# Patient Record
Sex: Female | Born: 1985 | Race: White | Hispanic: No | Marital: Single | Smoking: Never smoker
Health system: Southern US, Community
[De-identification: ages and names within clinical notes are randomized; demographics above are authoritative.]

## PROBLEM LIST (undated history)

## (undated) VITALS — BP 97/68 | HR 88 | Temp 97.9°F | Resp 16 | Ht <= 58 in | Wt 99.0 lb

## (undated) DIAGNOSIS — F329 Major depressive disorder, single episode, unspecified: Secondary | ICD-10-CM

## (undated) DIAGNOSIS — R569 Unspecified convulsions: Secondary | ICD-10-CM

## (undated) DIAGNOSIS — G40909 Epilepsy, unspecified, not intractable, without status epilepticus: Secondary | ICD-10-CM

## (undated) DIAGNOSIS — R51 Headache: Secondary | ICD-10-CM

## (undated) DIAGNOSIS — F32A Depression, unspecified: Secondary | ICD-10-CM

## (undated) DIAGNOSIS — E785 Hyperlipidemia, unspecified: Secondary | ICD-10-CM

## (undated) DIAGNOSIS — G43909 Migraine, unspecified, not intractable, without status migrainosus: Secondary | ICD-10-CM

## (undated) DIAGNOSIS — K529 Noninfective gastroenteritis and colitis, unspecified: Secondary | ICD-10-CM

## (undated) DIAGNOSIS — F419 Anxiety disorder, unspecified: Secondary | ICD-10-CM

## (undated) DIAGNOSIS — G47419 Narcolepsy without cataplexy: Secondary | ICD-10-CM

## (undated) HISTORY — DX: Epilepsy, unspecified, not intractable, without status epilepticus: G40.909

## (undated) HISTORY — PX: BILATERAL HIP ARTHROSCOPY: SUR89

## (undated) HISTORY — DX: Migraine, unspecified, not intractable, without status migrainosus: G43.909

## (undated) HISTORY — DX: Narcolepsy without cataplexy: G47.419

## (undated) HISTORY — DX: Hyperlipidemia, unspecified: E78.5

---
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
## 2004-02-16 ENCOUNTER — Ambulatory Visit: Payer: Self-pay | Admitting: Internal Medicine

## 2004-02-28 ENCOUNTER — Ambulatory Visit (HOSPITAL_COMMUNITY): Admission: RE | Admit: 2004-02-28 | Discharge: 2004-02-28 | Payer: Self-pay | Admitting: Internal Medicine

## 2004-02-28 ENCOUNTER — Ambulatory Visit: Payer: Self-pay | Admitting: Internal Medicine

## 2004-03-08 ENCOUNTER — Ambulatory Visit: Payer: Self-pay | Admitting: Internal Medicine

## 2004-03-09 ENCOUNTER — Ambulatory Visit: Payer: Self-pay | Admitting: Internal Medicine

## 2004-09-12 ENCOUNTER — Other Ambulatory Visit: Admission: RE | Admit: 2004-09-12 | Discharge: 2004-09-12 | Payer: Self-pay | Admitting: Obstetrics and Gynecology

## 2005-10-02 ENCOUNTER — Other Ambulatory Visit: Admission: RE | Admit: 2005-10-02 | Discharge: 2005-10-02 | Payer: Self-pay | Admitting: Obstetrics and Gynecology

## 2005-10-02 ENCOUNTER — Encounter: Payer: Self-pay | Admitting: Family Medicine

## 2005-10-02 LAB — CONVERTED CEMR LAB

## 2005-10-17 ENCOUNTER — Ambulatory Visit: Payer: Self-pay | Admitting: Family Medicine

## 2006-08-16 ENCOUNTER — Emergency Department (HOSPITAL_COMMUNITY): Admission: EM | Admit: 2006-08-16 | Discharge: 2006-08-16 | Payer: Self-pay | Admitting: Emergency Medicine

## 2006-08-19 ENCOUNTER — Encounter: Payer: Self-pay | Admitting: Family Medicine

## 2006-09-14 ENCOUNTER — Encounter: Payer: Self-pay | Admitting: Family Medicine

## 2006-09-15 ENCOUNTER — Encounter: Payer: Self-pay | Admitting: Family Medicine

## 2006-10-01 ENCOUNTER — Encounter: Payer: Self-pay | Admitting: Family Medicine

## 2006-10-01 DIAGNOSIS — F5000 Anorexia nervosa, unspecified: Secondary | ICD-10-CM | POA: Insufficient documentation

## 2006-10-01 DIAGNOSIS — K3184 Gastroparesis: Secondary | ICD-10-CM | POA: Insufficient documentation

## 2006-10-01 DIAGNOSIS — K219 Gastro-esophageal reflux disease without esophagitis: Secondary | ICD-10-CM | POA: Insufficient documentation

## 2006-10-01 DIAGNOSIS — N946 Dysmenorrhea, unspecified: Secondary | ICD-10-CM | POA: Insufficient documentation

## 2006-10-01 DIAGNOSIS — G40309 Generalized idiopathic epilepsy and epileptic syndromes, not intractable, without status epilepticus: Secondary | ICD-10-CM | POA: Insufficient documentation

## 2006-10-01 DIAGNOSIS — F988 Other specified behavioral and emotional disorders with onset usually occurring in childhood and adolescence: Secondary | ICD-10-CM | POA: Insufficient documentation

## 2006-10-01 DIAGNOSIS — F411 Generalized anxiety disorder: Secondary | ICD-10-CM | POA: Insufficient documentation

## 2006-10-01 DIAGNOSIS — K5900 Constipation, unspecified: Secondary | ICD-10-CM | POA: Insufficient documentation

## 2006-10-01 DIAGNOSIS — J309 Allergic rhinitis, unspecified: Secondary | ICD-10-CM | POA: Insufficient documentation

## 2006-10-01 DIAGNOSIS — R110 Nausea: Secondary | ICD-10-CM | POA: Insufficient documentation

## 2006-10-02 ENCOUNTER — Ambulatory Visit: Payer: Self-pay | Admitting: Family Medicine

## 2006-10-02 DIAGNOSIS — H6980 Other specified disorders of Eustachian tube, unspecified ear: Secondary | ICD-10-CM | POA: Insufficient documentation

## 2006-10-02 DIAGNOSIS — G40309 Generalized idiopathic epilepsy and epileptic syndromes, not intractable, without status epilepticus: Secondary | ICD-10-CM | POA: Insufficient documentation

## 2006-10-02 DIAGNOSIS — G47411 Narcolepsy with cataplexy: Secondary | ICD-10-CM | POA: Insufficient documentation

## 2007-03-16 ENCOUNTER — Ambulatory Visit: Payer: Self-pay | Admitting: Family Medicine

## 2007-03-16 DIAGNOSIS — R5383 Other fatigue: Secondary | ICD-10-CM

## 2007-03-16 DIAGNOSIS — R5381 Other malaise: Secondary | ICD-10-CM | POA: Insufficient documentation

## 2007-03-18 LAB — CONVERTED CEMR LAB
Basophils Absolute: 0 10*3/uL (ref 0.0–0.1)
Basophils Relative: 0.1 % (ref 0.0–1.0)
Eosinophils Relative: 0.8 % (ref 0.0–5.0)
Lymphocytes Relative: 31.6 % (ref 12.0–46.0)
MCV: 92.3 fL (ref 78.0–100.0)
Monocytes Absolute: 0.4 10*3/uL (ref 0.2–0.7)
Neutrophils Relative %: 60.9 % (ref 43.0–77.0)
Platelets: 265 10*3/uL (ref 150–400)
WBC: 6.7 10*3/uL (ref 4.5–10.5)

## 2007-03-20 ENCOUNTER — Telehealth: Payer: Self-pay | Admitting: Family Medicine

## 2007-03-23 ENCOUNTER — Telehealth: Payer: Self-pay | Admitting: Family Medicine

## 2007-09-02 ENCOUNTER — Encounter: Payer: Self-pay | Admitting: Family Medicine

## 2007-09-03 ENCOUNTER — Encounter: Admission: RE | Admit: 2007-09-03 | Discharge: 2007-09-03 | Payer: Self-pay | Admitting: Gastroenterology

## 2007-09-03 ENCOUNTER — Encounter: Payer: Self-pay | Admitting: Family Medicine

## 2007-09-04 ENCOUNTER — Encounter: Payer: Self-pay | Admitting: Family Medicine

## 2007-12-28 ENCOUNTER — Telehealth: Payer: Self-pay | Admitting: Family Medicine

## 2008-07-08 ENCOUNTER — Encounter: Payer: Self-pay | Admitting: Family Medicine

## 2008-07-12 ENCOUNTER — Telehealth (INDEPENDENT_AMBULATORY_CARE_PROVIDER_SITE_OTHER): Payer: Self-pay | Admitting: *Deleted

## 2008-07-18 ENCOUNTER — Ambulatory Visit: Payer: Self-pay | Admitting: Family Medicine

## 2008-07-18 DIAGNOSIS — N912 Amenorrhea, unspecified: Secondary | ICD-10-CM | POA: Insufficient documentation

## 2008-07-18 DIAGNOSIS — I8390 Asymptomatic varicose veins of unspecified lower extremity: Secondary | ICD-10-CM | POA: Insufficient documentation

## 2008-07-25 ENCOUNTER — Ambulatory Visit: Payer: Self-pay | Admitting: Family Medicine

## 2008-07-26 LAB — CONVERTED CEMR LAB
ALT: 18 units/L (ref 0–35)
Albumin: 4.3 g/dL (ref 3.5–5.2)
Alkaline Phosphatase: 62 units/L (ref 39–117)
BUN: 9 mg/dL (ref 6–23)
Basophils Absolute: 0 10*3/uL (ref 0.0–0.1)
Basophils Relative: 0.3 % (ref 0.0–3.0)
CO2: 30 meq/L (ref 19–32)
Calcium: 9.4 mg/dL (ref 8.4–10.5)
Chloride: 106 meq/L (ref 96–112)
Cholesterol: 295 mg/dL — ABNORMAL HIGH (ref 0–200)
Creatinine, Ser: 1 mg/dL (ref 0.4–1.2)
Direct LDL: 174.9 mg/dL
FSH: 7.7 milliintl units/mL
GFR calc non Af Amer: 72.51 mL/min (ref >60)
Glucose, Bld: 87 mg/dL (ref 70–99)
HCT: 39.3 % (ref 36.0–46.0)
Hemoglobin: 13.3 g/dL (ref 12.0–15.0)
Lymphs Abs: 2.4 10*3/uL (ref 0.7–4.0)
MCV: 91.8 fL (ref 78.0–100.0)
Neutro Abs: 2.3 10*3/uL (ref 1.4–7.7)
Platelets: 219 10*3/uL (ref 150.0–400.0)
RBC: 4.29 M/uL (ref 3.87–5.11)
Sodium: 141 meq/L (ref 135–145)
Triglycerides: 41 mg/dL (ref 0.0–149.0)
VLDL: 8.2 mg/dL (ref 0.0–40.0)
Vitamin B-12: 980 pg/mL — ABNORMAL HIGH (ref 211–911)
WBC: 5.3 10*3/uL (ref 4.5–10.5)

## 2008-11-03 ENCOUNTER — Ambulatory Visit: Payer: Self-pay | Admitting: Family Medicine

## 2008-11-03 DIAGNOSIS — E7800 Pure hypercholesterolemia, unspecified: Secondary | ICD-10-CM | POA: Insufficient documentation

## 2008-11-03 LAB — CONVERTED CEMR LAB
ALT: 17 units/L (ref 0–35)
Cholesterol: 258 mg/dL — ABNORMAL HIGH (ref 0–200)
HDL: 96.2 mg/dL (ref >39.00)
Triglycerides: 45 mg/dL (ref 0.0–149.0)

## 2009-01-12 ENCOUNTER — Encounter: Payer: Self-pay | Admitting: Family Medicine

## 2009-01-12 ENCOUNTER — Emergency Department (HOSPITAL_COMMUNITY): Admission: EM | Admit: 2009-01-12 | Discharge: 2009-01-12 | Payer: Self-pay | Admitting: Emergency Medicine

## 2009-01-25 ENCOUNTER — Ambulatory Visit: Payer: Self-pay | Admitting: Family Medicine

## 2009-01-25 DIAGNOSIS — R519 Headache, unspecified: Secondary | ICD-10-CM | POA: Insufficient documentation

## 2009-01-25 DIAGNOSIS — R51 Headache: Secondary | ICD-10-CM | POA: Insufficient documentation

## 2009-01-25 DIAGNOSIS — J029 Acute pharyngitis, unspecified: Secondary | ICD-10-CM | POA: Insufficient documentation

## 2009-01-25 DIAGNOSIS — M542 Cervicalgia: Secondary | ICD-10-CM | POA: Insufficient documentation

## 2009-05-18 ENCOUNTER — Telehealth: Payer: Self-pay | Admitting: Family Medicine

## 2009-06-29 ENCOUNTER — Encounter: Payer: Self-pay | Admitting: Family Medicine

## 2009-07-18 ENCOUNTER — Ambulatory Visit (HOSPITAL_COMMUNITY): Admission: RE | Admit: 2009-07-18 | Discharge: 2009-07-18 | Payer: Self-pay | Admitting: Endocrinology

## 2009-07-19 ENCOUNTER — Encounter: Admission: RE | Admit: 2009-07-19 | Discharge: 2009-07-19 | Payer: Self-pay | Admitting: Endocrinology

## 2009-09-06 ENCOUNTER — Encounter: Admission: RE | Admit: 2009-09-06 | Discharge: 2009-09-06 | Payer: Self-pay | Admitting: Sports Medicine

## 2009-11-07 ENCOUNTER — Encounter (INDEPENDENT_AMBULATORY_CARE_PROVIDER_SITE_OTHER): Payer: Self-pay | Admitting: *Deleted

## 2010-03-25 ENCOUNTER — Encounter: Payer: Self-pay | Admitting: Internal Medicine

## 2010-04-03 NOTE — Progress Notes (Signed)
  Phone Note Call from Patient   Caller: Patient Summary of Call: Patient wants to be referred to Dr Dorisann Frames for Endocrinology for hormone problems. Please call her back [phone], [phone]. Initial call taken by: Carlton Adam,  May 18, 2009 11:15 AM  Follow-up for Phone Call        before I refer - please have her list some of her symptoms so I know what ref is for- thanks Follow-up by: Judith Part MD,  May 18, 2009 1:38 PM  Additional Follow-up for Phone Call Additional follow up Details #1::        Symptoms are really bad nite sweats and hot flashes, no period for the last 4 years, unexplained weight gain, menopausal symptoms in general.Patient saw an Endocrinologist when she was 25 years old B/C she was abnormally short for her age and so they wanted to check up on that. They told her mother back then that she should FU with an Endocrinologist in the future if she has any type of abnormal problems not explained by anything. She also has fatigue issues that are not normal for her. Additional Follow-up by: Carlton Adam,  May 22, 2009 12:37 PM    Additional Follow-up for Phone Call Additional follow up Details #2::    thanks - I will do ref and route to [facility] Follow-up by: Judith Part MD,  May 22, 2009 11:09 PM  Additional Follow-up for Phone Call Additional follow up Details #3:: Details for Additional Follow-up Action Taken: Referral request for Endocrinology faxed to Dr Talmage Nap, Dellia Nims fro an appt to be made. Additional Follow-up by: Carlton Adam,  May 23, 2009 11:43 AM

## 2010-04-03 NOTE — Letter (Signed)
Summary: [place] No Show Letter  [place] [facility]  [address] [place], [place] [address]   Phone: [phone]  Fax: [phone]    11/07/2009 MRN: [id number]  [facility] [address] [place], [place]  [address]   Dear Ms. Fleishman,   Our records indicate that you missed your scheduled appointment with _______LAB______________ on _____9/2/11_______.  Please contact this office to reschedule your appointment as soon as possible.  It is important that you keep your scheduled appointments with your physician, so we can provide you the best care possible.  Please be advised that there may be a charge for "no show" appointments.    Sincerely,   [place] at [facility]

## 2010-04-03 NOTE — Consult Note (Signed)
Summary: [facility]   Imported By: Maryln Gottron 10/17/2009 11:09:24  _____________________________________________________________________  External Attachment:    Type:   Image     Comment:   External Document

## 2010-06-21 ENCOUNTER — Other Ambulatory Visit: Payer: Self-pay | Admitting: Family Medicine

## 2010-06-21 DIAGNOSIS — L989 Disorder of the skin and subcutaneous tissue, unspecified: Secondary | ICD-10-CM

## 2010-06-21 DIAGNOSIS — R221 Localized swelling, mass and lump, neck: Secondary | ICD-10-CM

## 2010-06-22 ENCOUNTER — Other Ambulatory Visit: Payer: Self-pay

## 2010-06-25 ENCOUNTER — Ambulatory Visit
Admission: RE | Admit: 2010-06-25 | Discharge: 2010-06-25 | Disposition: A | Discharge status: Home / Self Care | Payer: 59 | Source: Ambulatory Visit | Attending: Family Medicine | Admitting: Family Medicine

## 2010-06-25 DIAGNOSIS — L989 Disorder of the skin and subcutaneous tissue, unspecified: Secondary | ICD-10-CM

## 2010-06-25 DIAGNOSIS — R221 Localized swelling, mass and lump, neck: Secondary | ICD-10-CM

## 2012-05-26 ENCOUNTER — Encounter (HOSPITAL_COMMUNITY): Payer: Self-pay | Admitting: *Deleted

## 2012-05-26 ENCOUNTER — Inpatient Hospital Stay (HOSPITAL_COMMUNITY)
Admission: RE | Admit: 2012-05-26 | Discharge: 2012-05-29 | DRG: 426 | Disposition: A | Discharge status: Home / Self Care | Payer: BC Managed Care – PPO | Attending: Psychiatry | Admitting: Psychiatry

## 2012-05-26 DIAGNOSIS — Z79899 Other long term (current) drug therapy: Secondary | ICD-10-CM

## 2012-05-26 DIAGNOSIS — F411 Generalized anxiety disorder: Secondary | ICD-10-CM | POA: Diagnosis present

## 2012-05-26 DIAGNOSIS — F32A Depression, unspecified: Secondary | ICD-10-CM | POA: Diagnosis present

## 2012-05-26 DIAGNOSIS — G40309 Generalized idiopathic epilepsy and epileptic syndromes, not intractable, without status epilepticus: Secondary | ICD-10-CM | POA: Diagnosis present

## 2012-05-26 DIAGNOSIS — R45851 Suicidal ideations: Secondary | ICD-10-CM

## 2012-05-26 DIAGNOSIS — F3289 Other specified depressive episodes: Principal | ICD-10-CM | POA: Diagnosis present

## 2012-05-26 DIAGNOSIS — F329 Major depressive disorder, single episode, unspecified: Secondary | ICD-10-CM

## 2012-05-26 DIAGNOSIS — F5000 Anorexia nervosa, unspecified: Secondary | ICD-10-CM | POA: Diagnosis present

## 2012-05-26 HISTORY — DX: Headache: R51

## 2012-05-26 HISTORY — DX: Noninfective gastroenteritis and colitis, unspecified: K52.9

## 2012-05-26 HISTORY — DX: Depression, unspecified: F32.A

## 2012-05-26 HISTORY — DX: Major depressive disorder, single episode, unspecified: F32.9

## 2012-05-26 HISTORY — DX: Anxiety disorder, unspecified: F41.9

## 2012-05-26 HISTORY — DX: Unspecified convulsions: R56.9

## 2012-05-26 LAB — COMPREHENSIVE METABOLIC PANEL
ALT: 14 U/L (ref 0–35)
AST: 26 U/L (ref 0–37)
Albumin: 4.4 g/dL (ref 3.5–5.2)
CO2: 31 mEq/L (ref 19–32)
Calcium: 10.2 mg/dL (ref 8.4–10.5)
Creatinine, Ser: 0.73 mg/dL (ref 0.50–1.10)
Sodium: 140 mEq/L (ref 135–145)
Total Protein: 7.8 g/dL (ref 6.0–8.3)

## 2012-05-26 LAB — CBC
MCH: 30.4 pg (ref 26.0–34.0)
MCHC: 33.1 g/dL (ref 30.0–36.0)
MCV: 91.9 fL (ref 78.0–100.0)
Platelets: 274 10*3/uL (ref 150–400)
RBC: 4.57 MIL/uL (ref 3.87–5.11)
RDW: 12.3 % (ref 11.5–15.5)

## 2012-05-26 MED ORDER — LAMOTRIGINE 200 MG PO TABS
200.0000 mg | ORAL_TABLET | Freq: Two times a day (BID) | ORAL | Status: DC
  Administered 2012-05-26 – 2012-05-29 (×6): 200 mg via ORAL
  Filled 2012-05-26: qty 2
  Filled 2012-05-26 (×10): qty 1

## 2012-05-26 MED ORDER — TRAZODONE HCL 50 MG PO TABS
50.0000 mg | ORAL_TABLET | Freq: Every evening | ORAL | Status: DC | PRN
  Administered 2012-05-26: 50 mg via ORAL
  Filled 2012-05-26 (×7): qty 1

## 2012-05-26 MED ORDER — MAGNESIUM HYDROXIDE 400 MG/5ML PO SUSP: 30.0000 mL | Freq: Every day | ORAL | Status: DC | PRN

## 2012-05-26 MED ORDER — ACETAMINOPHEN 325 MG PO TABS
650.0000 mg | ORAL_TABLET | Freq: Four times a day (QID) | ORAL | Status: DC | PRN
  Administered 2012-05-26 – 2012-05-27 (×2): 650 mg via ORAL

## 2012-05-26 MED ORDER — HYDROXYZINE HCL 25 MG PO TABS: 25.0000 mg | ORAL_TABLET | Freq: Four times a day (QID) | ORAL | Status: DC | PRN

## 2012-05-26 MED ORDER — ALUM & MAG HYDROXIDE-SIMETH 200-200-20 MG/5ML PO SUSP: 30.0000 mL | ORAL | Status: DC | PRN

## 2012-05-26 NOTE — Progress Notes (Signed)
Patient ID: Abigail Lowery, female   DOB: 11/09/1985, 27 y.o.   MRN: [id number]  Pt admission documentation was finished From [facility] navigators) to the end.

## 2012-05-26 NOTE — Progress Notes (Signed)
Pt reports she is doing ok since she got here.  She has been introduced to the other young ladies on the unit and says she is feeling better about being here.  She says if she has any harmful thoughts, she will come to staff.  She denies AVH.  Pt was encouraged to make her needs known to staff.  Support/encouragement given.  Pt voiced no needs/concerns at this time.  Safety maintained with q15 minute checks.
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
## 2012-05-27 DIAGNOSIS — F411 Generalized anxiety disorder: Secondary | ICD-10-CM

## 2012-05-27 DIAGNOSIS — F332 Major depressive disorder, recurrent severe without psychotic features: Secondary | ICD-10-CM

## 2012-05-27 LAB — RAPID URINE DRUG SCREEN, HOSP PERFORMED
Amphetamines: NOT DETECTED
Benzodiazepines: NOT DETECTED
Opiates: NOT DETECTED

## 2012-05-27 LAB — URINALYSIS, ROUTINE W REFLEX MICROSCOPIC
Glucose, UA: NEGATIVE mg/dL
Leukocytes, UA: NEGATIVE
Nitrite: NEGATIVE
Specific Gravity, Urine: 1.01 (ref 1.005–1.030)
pH: 7.5 (ref 5.0–8.0)

## 2012-05-27 LAB — PREGNANCY, URINE: Preg Test, Ur: NEGATIVE

## 2012-05-27 MED ORDER — BUPROPION HCL 75 MG PO TABS
37.5000 mg | ORAL_TABLET | Freq: Every day | ORAL | Status: DC
  Administered 2012-05-27 – 2012-05-29 (×3): 37.5 mg via ORAL
  Filled 2012-05-27 (×4): qty 0.5
  Filled 2012-05-27: qty 1

## 2012-05-27 MED ORDER — TRAZODONE 25 MG HALF TABLET
25.0000 mg | ORAL_TABLET | Freq: Every evening | ORAL | Status: DC | PRN
  Administered 2012-05-27 – 2012-05-28 (×2): 25 mg via ORAL
  Filled 2012-05-27 (×8): qty 1

## 2012-05-27 NOTE — Progress Notes (Signed)
Recreation Therapy Notes  Date: 03.26.2014 Time: 3:00pm Location: [facility]      Group Topic/Focus: Goal Setting  Participation Level: Active  Participation Quality: Appropriate  Affect: Euthymic  Cognitive: Appropriate  Additional Comments: Patient with peer play "Joined at the Hip" a game that required patients hold a beach ball between their hips and set a goal for how far they think they can walk without the ball dropping to the floor. Patient with LRT set goal and reached goal. Patient with LRT set goal for how far they could walk holding the beach ball with one finger. Patient with LRT reached goal. Patient offered three suggestions to peer for reaching personal goal. Patient actively participated in group activity. Patient participated in discussion about importance of goals setting and how it can effect communication skills and self-esteem.   Marykay Lex Guenther [place], LRT/CTRS  Devereaux Grayson L 05/27/2012 4:09 PM
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
## 2012-05-27 NOTE — [facility] Suicide Risk Assessment (Signed)
Suicide Risk Assessment  Admission Assessment     Nursing information obtained from:    Demographic factors:    Current Mental Status:   Alert and oriented to 4. Denies SI/HI/AH/VH. Loss Factors:   educational issues Historical Factors:   Long history of depression Risk Reduction Factors:   compliant with traetment  CLINICAL FACTORS:   Depression:   Anhedonia Hopelessness Impulsivity Insomnia Severe  COGNITIVE FEATURES THAT CONTRIBUTE TO RISK:  Thought constriction (tunnel vision)    SUICIDE RISK:   Mild:  Suicidal ideation of limited frequency, intensity, duration, and specificity.  There are no identifiable plans, no associated intent, mild dysphoria and related symptoms, good self-control (both objective and subjective assessment), few other risk factors, and identifiable protective factors, including available and accessible social support.  PLAN OF CARE: Adjust medications as needed. Provide supportive counselling.  I certify that inpatient services furnished can reasonably be expected to improve the patient's condition.  Abigail Lowery 05/27/2012, 1:39 PM

## 2012-05-27 NOTE — Tx Team (Signed)
Interdisciplinary Treatment Plan Update   Date Reviewed:  05/27/2012  Time Reviewed:  10:05 AM  Progress in Treatment:   Attending groups: Yes Participating in groups: Yes Taking medication as prescribed: Yes  Tolerating medication: Yes Family/Significant other contact made: Patient to be asked for consent to make contact with family. Patient understands diagnosis: Yes  Discussing patient identified problems/goals with staff: Yes Medical problems stabilized or resolved: Yes Denies suicidal/homicidal ideation: Yes Patient has not harmed self or others: Yes  For review of initial/current patient goals, please see plan of care.  Estimated Length of Stay:  2-4 days  Reasons for Continued Hospitalization:  Anxiety Depression Medication stabilization  New Problems/Goals identified:    Discharge Plan or Barriers:   Home with outpatient follow up  Additional Comments:   Patient advised of increased depression over the past two months but unable to identify a specific stressor.  She endorsed SI prior to admission but denies SI/HI at this time.  Patient rates depression at eight and anxiety at nine.  She advised of having outpatient providers and access to medications.  Attendees:  Patient:  05/27/2012 10:05 AM   Signature: Patrick North, MD 05/27/2012 10:05 AM  Signature:Tina Arlana Pouch, RN 05/27/2012 10:05 AM  Signature: Harold Barban, RN 05/27/2012 10:05 AM  Signature: 05/27/2012 10:05 AM  Signature:   05/27/2012 10:05 AM  Signature:  Juline Patch, LCSW 05/27/2012 10:05 AM  Signature:  05/27/2012 10:05 AM  Signature:  05/27/2012 10:05 AM  Signature: Fransisca Kaufmann, [facility] 05/27/2012 10:05 AM  Signature:    Signature:    Signature:      Scribe for Treatment Team:   Juline Patch,  05/27/2012 10:05 AM

## 2012-05-27 NOTE — Progress Notes (Signed)
  D) Patient pleasant and cooperative upon my assessment. Patient completed Patient Self Inventory, reports slept "fair," and  appetite is "poor." Patient states "I will eat at snack times." Patient rates depression as   5/10, patient rates hopeless feelings as  5/10. Patient endorses passive SI, contracts verbally for safety with RN. Patient denies HI, denies A/V hallucinations.   A) Patient offered support and encouragement, patient encouraged to discuss feelings/concerns with staff. Patient verbalized understanding. Patient monitored Q15 minutes for safety. Patient met with MD  to discuss today's goals and plan of care.  R) Patient visible in milieu, attending groups in day room and meals in dining room. Patient appropriate with staff and peers.   Patient taking medications as ordered. Patient has a plan to "change my eating habits and sleep more normally."  Will continue to monitor.

## 2012-05-27 NOTE — [facility] Counselor (Signed)
Adult Comprehensive Assessment  Patient ID: Abigail Lowery, female   DOB: 02-25-1986, 27 y.o.   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  She reports struggling with classes due to her illness Employment / Job issues: Patient is employed.  She reports getting to work late due to depression and Narcolepsy Family Relationships: None Financial / Lack of resources (include bankruptcy): Could use more money Housing / Lack of housing: None - patient lives with parents Physical health (include injuries & life threatening diseases): Narcolepsy, Epilepsy and Colitis Social relationships: Awkward in social relationship Substance abuse: None  Living/Environment/Situation:  Living Arrangements: Parent Living conditions (as described by patient or guardian): Tense due to her illness How long has patient lived in current situation?: six years What is atmosphere in current home: Comfortable;Loving;Supportive  Family History:  Marital status: Single Does patient have children?: No  Childhood History:  By whom was/is the patient raised?: Both parents Additional childhood history information: Patient reports ahving a great childhood Description of patient's relationship with caregiver when they were a child: Very good Patient's description of current relationship with people who raised him/her: Patient reprots relatioship with parents is good but tense Does patient have siblings?: Yes Number of Siblings: 2 Description of patient's current relationship with siblings: Good Did patient suffer any verbal/emotional/physical/sexual abuse as a child?: No Did patient suffer from severe childhood neglect?: No Has patient ever been sexually abused/assaulted/raped as an adolescent or adult?: No Was the patient ever a victim of a crime or a disaster?: No Witnessed domestic violence?: No Has patient  been effected by domestic violence as an adult?: No  Education:  Highest grade of school patient has completed: Automotive engineer and one year of study towards Masters Currently a Consulting civil engineer?: No Contact person: Film/video editor [phone] Learning disability?: No  Employment/Work Situation:   Employment situation: Employed Where is patient currently employed?: [place] long has patient been employed?: 4 years Patient's job has been impacted by current illness: Yes Describe how patient's job has been implacted: Tends to get to work late What is the longest time patient has a held a job?: four years Where was the patient employed at that time?: Currently employer Has patient ever been in the [employer]?: No Has patient ever served in combat?: No  Financial Resources:   Financial resources: Income from employment Does patient have a representative payee or guardian?: No  Alcohol/Substance Abuse:   What has been your use of drugs/alcohol within the last 12 months?: None If attempted suicide, did drugs/alcohol play a role in this?: No Alcohol/Substance Abuse Treatment Hx: Denies past history Has alcohol/substance abuse ever caused legal problems?: No  Social Support System:   Forensic psychologist System: None Describe Community Support System: N/A Type of faith/religion: Catholic How does patient's faith help to cope with current illness?: Goes to confession  Leisure/Recreation:   Leisure and Hobbies: Music, Arts and crafts  Strengths/Needs:   What things does the patient do well?: Singing, Playing flute In what areas does patient struggle / problems for patient: Mental and physical illness  Discharge Plan:   Does patient have access to transportation?: Yes Will patient be returning to same living situation after discharge?: Yes Currently receiving community mental health services: Yes (From Whom) (Dr. Nolen Mu, [facility] and [facility]) Does patient have  financial barriers related to discharge medications?: No  Summary/Recommendations:  Abigail Lowery is a 27 years old  Caucasian female admitted with Anxiety Disorder and Major Depression Disorder.  She will Patient will benefit from crisis stabilization, evaluation for medication management, psycho education groups for coping skills development, group therapy and assistance with discharge planning.     Abigail Lowery, Joesph July. 05/27/2012

## 2012-05-27 NOTE — Progress Notes (Signed)
[facility] INPATIENT:  Family/Significant Other Suicide Prevention Education  Suicide Prevention Education:  Education Completed: Andrena Margerum, Father, [phone] has been identified by the patient as the family member/significant other with whom the patient will be residing, and identified as the person(s) who will aid the patient in the event of a mental health crisis (suicidal ideations/suicide attempt).  With written consent from the patient, the family member/significant other has been provided the following suicide prevention education, prior to the and/or following the discharge of the patient.  The suicide prevention education provided includes the following:  Suicide risk factors  Suicide prevention and interventions  National Suicide Hotline telephone number  [facility] assessment telephone number  [facility] Emergency Assistance 911  [place] and/or Residential Mobile Crisis Unit telephone number  Request made of family/significant other to:  Remove weapons (e.g., guns, rifles, knives), all items previously/currently identified as safety concern.  Father reports there are no guns in the home.  Remove drugs/medications (over-the-counter, prescriptions, illicit drugs), all items previously/currently identified as a safety concern.  The family member/significant other verbalizes understanding of the suicide prevention education information provided.  The family member/significant other agrees to remove the items of safety concern listed above.  Wynn Banker 05/27/2012, 3:58 PM

## 2012-05-27 NOTE — Progress Notes (Signed)
D: Patient's parents visited, she reported a good visit with parents. She appeared bright and appropriate on approach. Patient stated that she had a rough day in the morning; "My day started off  frustrating, they won't give me my medication for narcolepsy, my psychologist called to check on me and I felt better after she spoke with me". She reported that the doctor started her on antidepressant, and that her bedtime Trazodone was too much and the physician promised to reduce the dosage.  A: Writer checked patient medication orders and there wasn't any change in the dosage of Trazodone; writer to notified the PA whenever he is less busy and on the floor and have the dosage changed.Patient encouraged and supported. She denied SI/HI and denied Hallucinations. R: Patient receptive to encouragement and support

## 2012-05-27 NOTE — Progress Notes (Signed)
[facility] LCSW Group Therapy  Emotional Regulation  05/27/2012 3:55 PM  Type of Therapy:  Group Therapy  Participation Level:  Active  Participation Quality:  Appropriate and Attentive  Affect:  Appropriate, Depressed, Flat and Tearful  Cognitive:  Appropriate  Insight:  Engaged  Engagement in Therapy:  Engaged  Modes of Intervention:  Discussion, Education, Exploration, Problem-solving, Rapport Building and Support  Summary of Progress/Problems:  Patient shared the emotion she struggles with is feeling invalidated.  She was tearful as she talked about how she feels others would be better off if she were not here but is also able to consider how how death would effect those she loves.    Wynn Banker 05/27/2012, 3:55 PM

## 2012-05-28 NOTE — Clinical Social Work Note (Signed)
Late Entry for 05/27/12:  Writer spoke with patient's father who advised patient has a history of a serious eating disorder.  He shared during patient's senior year in high school she had lost down to 69 pounds and almost died.  He advised her disorder is not as bad at this time but it takes her two hours to eat the amount of food most people can eat in five minutes.  He also shared concern that patient is on a great deal of medications and he does not know how she functions.  Father also advised patient can sleep through the entire weekend without getting up to eat or use the bathroom.  MD and NP was advised of father's concerns.

## 2012-05-28 NOTE — Progress Notes (Signed)
[facility] MD Progress Note  05/28/2012 1:32 PM Abigail Lowery  MRN:  [id number] Subjective:  Abigail Lowery reports that she is feeling much better about being in the hospital today. She reports receiving a great deal of support from peers and family yesterday. Patient is attending all the groups but does report falling asleep at times. The patient is no longer upset that her adderrall was not continued in the hospital. Abigail Lowery states that she now "sees how her depression clouded her mind. I thought everyone would be better off without me, but now I see how that is a lie." Rates her depression at five and her anxiety at four. Patient stated "Normally my baseline depression is a seven so I know that I am feeling better."  Diagnosis:   Axis I: Anxiety Disorder NOS and Major Depression, Recurrent severe Axis II: Deferred Axis III:  Past Medical History  Diagnosis Date  . Seizures     Petit mal  . Anxiety   . Depression   . Headache   . Colitis 2003   Axis IV: educational problems and other psychosocial or environmental problems Axis V: 51-60 moderate symptoms  ADL's:  Intact  Sleep: Good  Appetite:  Fair  Suicidal Ideation:  Denies Homicidal Ideation:  Denies AEB (as evidenced by):  Psychiatric Specialty Exam: Review of Systems  Constitutional: Negative.   HENT: Negative.   Eyes: Negative.   Respiratory: Negative.   Cardiovascular: Negative.   Gastrointestinal: Positive for nausea.  Genitourinary: Negative.   Musculoskeletal: Negative.   Neurological: Positive for dizziness.  Endo/Heme/Allergies: Negative.   Psychiatric/Behavioral: Positive for depression. Negative for suicidal ideas, hallucinations, memory loss and substance abuse. The patient is nervous/anxious and has insomnia.     Blood pressure 91/62, pulse 108, temperature 98 F (36.7 C), temperature source Oral, resp. rate 16, height 4\' 10"  (1.473 m), weight 44.906 kg (99 lb), last menstrual period 05/15/2012, SpO2 100.00%.Body  mass index is 20.7 kg/(m^2).  General Appearance: Casual  Eye Contact::  Good  Speech:  Clear and Coherent  Volume:  Normal  Mood:  Anxious  Affect:  Appropriate  Thought Process:  Disorganized and Intact  Orientation:  Full (Time, Place, and Person)  Thought Content:  WDL  Suicidal Thoughts:  No  Homicidal Thoughts:  No  Memory:  Immediate;   Good Recent;   Good Remote;   Good  Judgement:  Fair  Insight:  Good  Psychomotor Activity:  Normal  Concentration:  Good  Recall:  Good  Akathisia:  No  Handed:  Right  AIMS (if indicated):     Assets:  Communication Skills Desire for Improvement Intimacy Leisure Time Physical Health Social Support Talents/Skills Vocational/Educational  Sleep:  Number of Hours: 5.75   Current Medications: Current Facility-Administered Medications  Medication Dose Route Frequency Provider Last Rate Last Dose  . acetaminophen (TYLENOL) tablet 650 mg  650 mg Oral Q6H PRN Karolee Stamps, NP   650 mg at 05/27/12 1538  . alum & mag hydroxide-simeth (MAALOX/MYLANTA) 200-200-20 MG/5ML suspension 30 mL  30 mL Oral Q4H PRN Karolee Stamps, NP      . buPROPion [facility]) tablet 37.5 mg  37.5 mg Oral Daily Kariel Skillman, MD   37.5 mg at 05/28/12 0801  . hydrOXYzine (ATARAX/VISTARIL) tablet 25 mg  25 mg Oral Q6H PRN Karolee Stamps, NP      . lamoTRIgine (LAMICTAL) tablet 200 mg  200 mg Oral BID Karolee Stamps, NP   200 mg at 05/28/12 0801  .  magnesium hydroxide (MILK OF MAGNESIA) suspension 30 mL  30 mL Oral Daily PRN Karolee Stamps, NP      . traZODone (DESYREL) tablet 25 mg  25 mg Oral QHS,MR X 1 Kerry Hough, PA-C   25 mg at 05/27/12 2130    Lab Results:  Results for orders placed during the hospital encounter of 05/26/12 (from the past 48 hour(s))  CBC     Status: None   Collection Time    05/26/12  5:10 PM      Result Value Range   WBC 6.0  4.0 - 10.5 K/uL   RBC 4.57  3.87 - 5.11 MIL/uL   Hemoglobin 13.9  12.0 - 15.0 g/dL   HCT 45.4   09.8 - 11.9 %   MCV 91.9  78.0 - 100.0 fL   MCH 30.4  26.0 - 34.0 pg   MCHC 33.1  30.0 - 36.0 g/dL   RDW 14.7  82.9 - 56.2 %   Platelets 274  150 - 400 K/uL  COMPREHENSIVE METABOLIC PANEL     Status: None   Collection Time    05/26/12  5:10 PM      Result Value Range   Sodium 140  135 - 145 mEq/L   Potassium 4.8  3.5 - 5.1 mEq/L   Chloride 101  96 - 112 mEq/L   CO2 31  19 - 32 mEq/L   Glucose, Bld 78  70 - 99 mg/dL   BUN 11  6 - 23 mg/dL   Creatinine, Ser 1.30  0.50 - 1.10 mg/dL   Calcium 86.5  8.4 - 78.4 mg/dL   Total Protein 7.8  6.0 - 8.3 g/dL   Albumin 4.4  3.5 - 5.2 g/dL   AST 26  0 - 37 U/L   ALT 14  0 - 35 U/L   Alkaline Phosphatase 78  39 - 117 U/L   Total Bilirubin 0.3  0.3 - 1.2 mg/dL   GFR calc non Af Amer >90  >90 mL/min   GFR calc Af Amer >90  >90 mL/min   Comment:            The eGFR has been calculated     using the CKD EPI equation.     This calculation has not been     validated in all clinical     situations.     eGFR's persistently     <90 mL/min signify     possible Chronic Kidney Disease.  ETHANOL     Status: None   Collection Time    05/26/12  5:10 PM      Result Value Range   Alcohol, Ethyl (B) <11  0 - 11 mg/dL   Comment:            LOWEST DETECTABLE LIMIT FOR     SERUM ALCOHOL IS 11 mg/dL     FOR MEDICAL PURPOSES ONLY  PREGNANCY, URINE     Status: None   Collection Time    05/27/12  9:24 AM      Result Value Range   Preg Test, Ur NEGATIVE  NEGATIVE   Comment:            THE SENSITIVITY OF THIS     METHODOLOGY IS >20 mIU/mL.  URINALYSIS, ROUTINE W REFLEX MICROSCOPIC     Status: None   Collection Time    05/27/12  9:24 AM      Result Value Range   Color, Urine YELLOW  YELLOW   APPearance CLEAR  CLEAR   Specific Gravity, Urine 1.010  1.005 - 1.030   pH 7.5  5.0 - 8.0   Glucose, UA NEGATIVE  NEGATIVE mg/dL   Hgb urine dipstick NEGATIVE  NEGATIVE   Bilirubin Urine NEGATIVE  NEGATIVE   Ketones, ur NEGATIVE  NEGATIVE mg/dL   Protein,  ur NEGATIVE  NEGATIVE mg/dL   Urobilinogen, UA 0.2  0.0 - 1.0 mg/dL   Nitrite NEGATIVE  NEGATIVE   Leukocytes, UA NEGATIVE  NEGATIVE   Comment: MICROSCOPIC NOT DONE ON URINES WITH NEGATIVE PROTEIN, BLOOD, LEUKOCYTES, NITRITE, OR GLUCOSE <1000 mg/dL.  URINE RAPID DRUG SCREEN (HOSP PERFORMED)     Status: None   Collection Time    05/27/12  9:24 AM      Result Value Range   Opiates NONE DETECTED  NONE DETECTED   Cocaine NONE DETECTED  NONE DETECTED   Benzodiazepines NONE DETECTED  NONE DETECTED   Amphetamines NONE DETECTED  NONE DETECTED   Tetrahydrocannabinol NONE DETECTED  NONE DETECTED   Barbiturates NONE DETECTED  NONE DETECTED   Comment:            DRUG SCREEN FOR MEDICAL PURPOSES     ONLY.  IF CONFIRMATION IS NEEDED     FOR ANY PURPOSE, NOTIFY LAB     WITHIN 5 DAYS.                LOWEST DETECTABLE LIMITS     FOR URINE DRUG SCREEN     Drug Class       Cutoff (ng/mL)     Amphetamine      1000     Barbiturate      200     Benzodiazepine   200     Tricyclics       300     Opiates          300     Cocaine          300     THC              50    Physical Findings: AIMS: Facial and Oral Movements Muscles of Facial Expression: None, normal Lips and Perioral Area: None, normal Jaw: None, normal Tongue: None, normal,Extremity Movements Upper (arms, wrists, hands, fingers): None, normal Lower (legs, knees, ankles, toes): None, normal, Trunk Movements Neck, shoulders, hips: None, normal, Overall Severity Severity of abnormal movements (highest score from questions above): None, normal Incapacitation due to abnormal movements: None, normal Patient's awareness of abnormal movements (rate only patient's report): No Awareness, Dental Status Current problems with teeth and/or dentures?: No Does patient usually wear dentures?: No  CIWA:    COWS:     Treatment Plan Summary: Daily contact with patient to assess and evaluate symptoms and progress in treatment Medication  management  Plan: Continue crisis management and stabilization.  Medication management: Patient started on Wellbutrin 37.5 mg yesterday. She is reporting some mild nausea and dizziness.  Encouraged patient to attend groups and participate in group counseling sessions and activities.  Discharge plan in progress. Patient will follow up with [facility].  Address health issues: Vitals reviewed and stable.  Continue current treatment plan.   Medical Decision Making Problem Points:  Established problem, stable/improving (1) and Review of psycho-social stressors (1) Data Points:  Review of medication regiment & side effects (2)  I certify that inpatient services furnished can reasonably be expected to improve the patient's condition.   DAVIS,  LAURA ANN NP-C 05/28/2012, 1:32 PM

## 2012-05-28 NOTE — Progress Notes (Signed)
[facility] LCSW Group Therapy     [facility] 1:15 - 2:30 PM   05/28/2012 2:50 PM  Type of Therapy:  Group Therapy  Participation Level:  Minimal  Participation Quality:  Attentive  Affect:  Depressed  Cognitive:  Appropriate  Insight:  Engaged  Engagement in Therapy:  Limited  Modes of Intervention:  Discussion, Education, Exploration, Problem-solving, Rapport Building and Support  Summary of Progress/Problems:  Patient listened attentively to speaker from [facility].  She indicated that she would be interested in attending their programming.   Wynn Banker 05/28/2012, 2:50 PM

## 2012-05-28 NOTE — Progress Notes (Signed)
D: Patient denies SI/HI and auditory and visual hallucinations. The patient has an anxious mood and an appropriate affect. The patient rates her depression a 5 out of 10 and her hopelessness a 4 out of 10 (1 low/10 high). The patient reports sleeping fairly well and states that her energy level is hyper. The patient is attending groups and interacting appropriately within the milieu.  A: Patient given emotional support from RN. Patient encouraged to come to staff with concerns and/or questions. Patient's medication routine continued. Patient's orders and plan of care reviewed.  R: Patient remains cooperative. Will continue to monitor patient q15 minutes for safety.

## 2012-05-28 NOTE — Progress Notes (Addendum)
[facility] Aftercare Group       05/28/2012 2:43 PM  Type of Therapy:  Group Therapy  Participation Level:  Minimal  Participation Quality:  Drowsy  Affect:  Appropriate  Cognitive:  Appropriate  Insight:  Engaged  Engagement in Therapy:  Limited  Modes of Intervention:  Discussion, Education, Exploration, Problem-solving, Rapport Building and Support  Summary of Progress/Problems:  Patient advised of doing better today and denies SI/HI.  She rates depression and anxiety at five/six.  She shared it has been good to get things out in the open and be away from problem.  Patient informed Clinical research associate spoke with employer who shared her job is not in jeopardy and ask that she take care of herself.  Wynn Banker 05/28/2012, 2:43 PM

## 2012-05-29 DIAGNOSIS — F3289 Other specified depressive episodes: Principal | ICD-10-CM

## 2012-05-29 DIAGNOSIS — F329 Major depressive disorder, single episode, unspecified: Principal | ICD-10-CM

## 2012-05-29 MED ORDER — LAMOTRIGINE 200 MG PO TABS: 200.0000 mg | ORAL_TABLET | Freq: Two times a day (BID) | ORAL | Status: DC

## 2012-05-29 MED ORDER — FLUOXETINE HCL 10 MG PO CAPS: 10.0000 mg | ORAL_CAPSULE | Freq: Every day | ORAL | Status: DC

## 2012-05-29 MED ORDER — FLUOXETINE HCL 10 MG PO CAPS
10.0000 mg | ORAL_CAPSULE | Freq: Every day | ORAL | Status: DC
  Administered 2012-05-29: 10 mg via ORAL
  Filled 2012-05-29 (×3): qty 1

## 2012-05-29 NOTE — Progress Notes (Signed)
D: Patient denies SI/HI and auditory and visual hallucinations. The patient has an anxious mood and an appropriate affect. The patient rates her depression a 5 out of 10 and her hopelessness a 2 out of 10 (1 low/10 high). The patient reports sleeping well and states that her appetite is poor but that her energy level is high. The patient is concerned about her Wellbutrin and is asking the doctor if it is the right medication for her.  A: Patient given emotional support from RN. Patient encouraged to come to staff with concerns and/or questions. Patient's medication routine continued. Patient's orders and plan of care reviewed. Patient referred to MD for medication changes/requests.  R: Patient remains cooperative. Will continue to monitor patient q15 minutes for safety.

## 2012-05-29 NOTE — Progress Notes (Signed)
[facility] LCSW Aftercare Discharge Planning Group Note  05/29/2012 10:12 AM  Participation Quality:  Appropriate and Attentive  Affect:  Appropriate and Depressed  Cognitive:  Alert and Appropriate  Insight:  Engaged  Engagement in Group:  Engaged  Modes of Intervention:  Education, Exploration, Dentist, Rapport Building and Support  Summary of Progress/Problems:  Patient reports being better today and denies SI/HI.  She rates depression and anxiety at five.  Patient shared she is hopeful to discharge home soon.   Wynn Banker 05/29/2012, 10:12 AM

## 2012-05-29 NOTE — Progress Notes (Signed)
[facility] MD Progress Note  05/29/2012 10:39 AM AMYA HLAD  MRN:  [id number] Subjective: Patient seen in treatment team. Reports feeling dizzy on the wellbutrin. States her mom is concerned about her being on the wellbutrin since it can trigger seizures and eating disorder. She reported being hospitalized for 2 months with anorexia many years ago. When the issue of being on stimulants was brought up, patient reports they do not diminish her appetite, she eats better on the stimulants.  Diagnosis:   Axis I: Major Depression, Recurrent severe, Eating Disorder Axis II: Deferred Axis III:  Past Medical History  Diagnosis Date  . Seizures     Petit mal  . Anxiety   . Depression   . Headache   . Colitis 2003   Axis IV: educational problems and other psychosocial or environmental problems Axis V: 41-50 serious symptoms  ADL's:  Intact  Sleep: Fair  Appetite:  Fair   Psychiatric Specialty Exam: Review of Systems  Constitutional: Negative.   HENT: Negative.   Eyes: Negative.   Respiratory: Negative.   Cardiovascular: Negative.   Gastrointestinal: Positive for nausea.  Genitourinary: Negative.   Musculoskeletal: Negative.   Skin: Negative.   Neurological: Positive for dizziness.  Endo/Heme/Allergies: Negative.   Psychiatric/Behavioral: Positive for depression. The patient is nervous/anxious.     Blood pressure 97/68, pulse 88, temperature 97.9 F (36.6 C), temperature source Oral, resp. rate 16, height 4\' 10"  (1.473 m), weight 44.906 kg (99 lb), last menstrual period 05/15/2012, SpO2 100.00%.Body mass index is 20.7 kg/(m^2).  General Appearance: Disheveled  Eye Solicitor::  Fair  Speech:  Clear and Coherent  Volume:  Normal  Mood:  Depressed and Dysphoric  Affect:  Congruent  Thought Process:  Coherent  Orientation:  Full (Time, Place, and Person)  Thought Content:  Rumination  Suicidal Thoughts:  No  Homicidal Thoughts:  No  Memory:  Immediate;   Fair Recent;    Fair Remote;   Fair  Judgement:  Impaired  Insight:  Shallow  Psychomotor Activity:  Normal  Concentration:  Fair  Recall:  Fair  Akathisia:  No  Handed:  Right  AIMS (if indicated):     Assets:  Communication Skills Desire for Improvement Housing Social Support  Sleep:  Number of Hours: 6.25   Current Medications: Current Facility-Administered Medications  Medication Dose Route Frequency Provider Last Rate Last Dose  . acetaminophen (TYLENOL) tablet 650 mg  650 mg Oral Q6H PRN Karolee Stamps, NP   650 mg at 05/27/12 1538  . alum & mag hydroxide-simeth (MAALOX/MYLANTA) 200-200-20 MG/5ML suspension 30 mL  30 mL Oral Q4H PRN Karolee Stamps, NP      . buPROPion [facility]) tablet 37.5 mg  37.5 mg Oral Daily Rajinder Mesick, MD   37.5 mg at 05/29/12 0751  . hydrOXYzine (ATARAX/VISTARIL) tablet 25 mg  25 mg Oral Q6H PRN Karolee Stamps, NP      . lamoTRIgine (LAMICTAL) tablet 200 mg  200 mg Oral BID Karolee Stamps, NP   200 mg at 05/29/12 6213  . magnesium hydroxide (MILK OF MAGNESIA) suspension 30 mL  30 mL Oral Daily PRN Karolee Stamps, NP      . traZODone (DESYREL) tablet 25 mg  25 mg Oral QHS,MR X 1 Kerry Hough, PA-C   25 mg at 05/28/12 2154    Lab Results: No results found for this or any previous visit (from the past 48 hour(s)).  Physical Findings: AIMS: Facial and Oral Movements Muscles  of Facial Expression: None, normal Lips and Perioral Area: None, normal Jaw: None, normal Tongue: None, normal,Extremity Movements Upper (arms, wrists, hands, fingers): None, normal Lower (legs, knees, ankles, toes): None, normal, Trunk Movements Neck, shoulders, hips: None, normal, Overall Severity Severity of abnormal movements (highest score from questions above): None, normal Incapacitation due to abnormal movements: None, normal Patient's awareness of abnormal movements (rate only patient's report): No Awareness, Dental Status Current problems with teeth and/or dentures?:  No Does patient usually wear dentures?: No  CIWA:    COWS:     Treatment Plan Summary: Daily contact with patient to assess and evaluate symptoms and progress in treatment Medication management  Plan: Discussed the implications of taking Adderall upto 80mg  daily (for narcolepsy), patient very adamant about the stimulant not affecting her appetite. Patient made aware she will not be started on stimulant medication while in the hospital. Will discontinue wellbutrin given concerns about seizure risk and eating disorder. Start Prozac at 10mg , patient made aware of side effects and benefits. Contact parents to participate in treatment team meeting.  Medical Decision Making Problem Points:  Established problem, stable/improving (1), Review of last therapy session (1) and Review of psycho-social stressors (1) Data Points:  Review of medication regiment & side effects (2) Review of new medications or change in dosage (2)  I certify that inpatient services furnished can reasonably be expected to improve the patient's condition.   Lynia Landry 05/29/2012, 10:39 AM

## 2012-05-29 NOTE — Tx Team (Addendum)
Interdisciplinary Treatment Plan Update   Date Reviewed:  05/29/2012  Time Reviewed:  9:58 AM  Progress in Treatment:   Attending groups: Yes Participating in groups: Yes Taking medication as prescribed: Yes, patient started on Wellbutrin and seems to be tolerating well.    Tolerating medication: Yes Family/Significant other contact made: Yes, contact made with father.  Patient understands diagnosis: Yes  Discussing patient identified problems/goals with staff: Yes Medical problems stabilized or resolved: Yes Denies suicidal/homicidal ideation: Yes Patient has not harmed self or others: Yes  For review of initial/current patient goals, please see plan of care.  Estimated Length of Stay:  2-3 days  Reasons for Continued Hospitalization:  Anxiety Depression Medication stabilization  New Problems/Goals identified:    Discharge Plan or Barriers:   Home with outpatient follow up with Dr. Nolen Mu and [facility]  Additional Comments:  Patient reports doing well today.  She denies SI/HI and rates depression and anxiety at five.  She reports being in a genuinely good mood.  MD advised patient she will not be started on a stimulant due to history of eating disorder.  MD to review medications started to make sure there will not be a problems with medication due to history of eating disorder.  MD advised she will be discharging Wellbutrin.  Attendees:  Patient: Abigail Lowery 05/29/2012 9:58 AM   Signature: Patrick North, MD 05/29/2012 9:58 AM  Signature: Nestor Ramp, RN 05/29/2012 9:58 AM  Signature: Harold Barban, RN 05/29/2012 9:58 AM  Signature: 05/29/2012 9:58 AM  Signature:   05/29/2012 9:58 AM  Signature:  Juline Patch, LCSW 05/29/2012 9:58 AM  Signature: Silverio Decamp, PMH-NP 05/29/2012 9:58 AM  Signature: Liliane Bade, BSW 05/29/2012 9:58 AM  Signature:  05/29/2012 9:58 AM  Signature:    Signature:    Signature:      Scribe for Treatment Team:   Juline Patch,   05/29/2012 9:58 AM

## 2012-05-29 NOTE — Progress Notes (Signed)
Patient's father called to unit and was yelling at RN about his daughter's "medications being messed up." Patient's father reports that patient was calling him and stating that our "meds were not for her" and that she "hasn't been eating anything" because we "don't have any food" for her. RN questioned patient and asked why the patient had not mentioned anything to RN or MD. Patient stated that she "tried once" but that she "didn't want to bring it up." Patient encouraged to openly discuss issues with staff. Patient referred to MD for medication questions. Will continue to monitor patient for safety.

## 2012-05-29 NOTE — Progress Notes (Signed)
D  Pt was elated and happy when returning from group   She said she did not sing but had a good time   She is pleasant and cooperative and interacts well with others  She denies suicidal ideation at present A   Verbal support given  Medications administered and effectiveness monitored   Q 15 min checks R   Pt safe at present

## 2012-05-29 NOTE — Progress Notes (Signed)
[facility] LCSW Group Therapy        Feelings Around Relapse        1:15-2:30 PM    05/29/2012 2:57 PM  Type of Therapy:  Group Therapy  Participation Level:  Active  Participation Quality:  Appropriate  Affect:  Appropriate  Cognitive:  Appropriate  Insight:  Engaged  Engagement in Therapy:  Engaged  Modes of Intervention:  Discussion, Exploration, Problem-solving, Rapport Building and Support  Summary of Progress/Problems:  Patient shared she usually does not realized she has relapsed into her illness but is told by others.  She shared she stops drawing and painting and spends too much time with herself during a relapse.   Wynn Banker 05/29/2012, 2:57 PM

## 2012-05-29 NOTE — Progress Notes (Signed)
Pt discharged per MD orders; pt currently denies SI/HI and auditory/visual hallucinations; pt was given education by RN regarding follow-up appointments and medications and pt denied any questions or concerns about these instructions; pt was then escorted to search room to retrieve her belongings by RN before being discharged to hospital lobby. 

## 2012-05-29 NOTE — Discharge Summary (Signed)
Physician Discharge Summary Note  Patient:  Abigail Lowery is an 27 y.o., female MRN:  [id number] DOB:  May 04, 1985 Patient phone:  [phone] (home)  Patient address:   [address] [place] [address],   Date of Admission:  05/26/2012 Date of Discharge: 05/29/2012  Reason for Admission:  Depression with suicidal ideations  Discharge Diagnoses: Principal Problem:   Depression Active Problems:   Anorexia nervosa   ANXIETY   GRAND MAL SEIZURE  Review of Systems  Constitutional: Negative.   HENT: Negative.   Eyes: Negative.   Respiratory: Negative.   Cardiovascular: Negative.   Gastrointestinal: Negative.   Genitourinary: Negative.   Musculoskeletal: Negative.   Skin: Negative.   Neurological: Negative.   Endo/Heme/Allergies: Negative.   Psychiatric/Behavioral: Positive for depression. The patient is nervous/anxious.    Axis Diagnosis:   AXIS I:  Anxiety Disorder NOS and Depressive Disorder NOS AXIS II:  Cluster B Traits AXIS III:   Past Medical History  Diagnosis Date  . Seizures     Petit mal  . Anxiety   . Depression   . Headache   . Colitis 2003   AXIS IV:  other psychosocial or environmental problems, problems related to social environment and problems with primary support group AXIS V:  61-70 mild symptoms  Level of Care:  OP  Hospital Course:  On admission:  Pt was referred to [facility] by [facility] after patient came in and reported feeling depressed and suicidal. 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 She has been unable to concentrate, either can't sleep or oversleep, tearful, feeling worthless, lack of motivation, unable to complete or start tasks, and feeling inadequate. She reports it has affected her when her father talks down to her and says negative things about her. Pt reports a long history of cutting or burning herself and found herself cutting superficially two days ago to her wrists. She reports  her suicidal thoughts are more frequent and was unable to contract for safety. Today patient presents as very anxious to talk with provider about her medications. Patient was also anxious about missing an appointment at [facility] today stating "You are the first person I've seen. I could have just had that appointment if I wasn't here wasting time." Patient talked about failing a class at school last semester and has "been going down since." She has been experiencing panic attacks and has been missing classes. Patient reported having suicidal thoughts but never a specific plan. She reports never being diagnosed with Bipolar but in high school often had manic symptoms. She reports these nearly subsided when she was started on Lamictal for a seizure disorder. The patient reports a very mild seizure three days ago after missing a dose of Lamictal. Patient is concerned about not being on Adderrall while in the hospital as "I might just fall asleep. But at least I'm not driving while I'm here." Patient much calmer after speaking with provider. She agrees to attend groups and spend her time here at [facility] productively.   During hospitalization, her medications were managed as follows:  Adderall, ativan, and Pristiq discontinued.  Prozac 10 mg started for depression.  Lamictal for her seizure disorder continued.  Abigail Lowery's depression and anxiety decreased, feels her depression was "clouding her mind" and cause her not to be able to concentrate.  She wanted to discharge today but when it was denied by the MD, she called her father and complained to him about the food  and medications.  He called and blasted the RN and when the MD called him he was also belligerent.  Her dad wants her discharged, MD granted.  Abigail Lowery remains in denial about her eating disorder by stating she can't eat the food here because she is vegetarian and she needs her Adderall to increase her appetite, despite education that it decreases the appetite.  Cluster  B traits with manipulation and splitting of staff and family to get what she wants.  Patient denied suicidal/homicidal ideations and auditory/visual hallucinations, follow-up appointments encouraged to attend, Rx given.  She will continue her care at [facility] and with [facility].  Abigail Lowery is stable mentally and physically for discharge.  Consults:  None  Significant Diagnostic Studies:  labs: completed and reviewed, stable  Discharge Vitals:   Blood pressure 97/68, pulse 88, temperature 97.9 F (36.6 C), temperature source Oral, resp. rate 16, height 4\' 10"  (1.473 m), weight 44.906 kg (99 lb), last menstrual period 05/15/2012, SpO2 100.00%. Body mass index is 20.7 kg/(m^2). Lab Results:   Results for orders placed during the hospital encounter of 05/26/12 (from the past 72 hour(s))  CBC     Status: None   Collection Time    05/26/12  5:10 PM      Result Value Range   WBC 6.0  4.0 - 10.5 K/uL   RBC 4.57  3.87 - 5.11 MIL/uL   Hemoglobin 13.9  12.0 - 15.0 g/dL   HCT 14.7  82.9 - 56.2 %   MCV 91.9  78.0 - 100.0 fL   MCH 30.4  26.0 - 34.0 pg   MCHC 33.1  30.0 - 36.0 g/dL   RDW 13.0  86.5 - 78.4 %   Platelets 274  150 - 400 K/uL  COMPREHENSIVE METABOLIC PANEL     Status: None   Collection Time    05/26/12  5:10 PM      Result Value Range   Sodium 140  135 - 145 mEq/L   Potassium 4.8  3.5 - 5.1 mEq/L   Chloride 101  96 - 112 mEq/L   CO2 31  19 - 32 mEq/L   Glucose, Bld 78  70 - 99 mg/dL   BUN 11  6 - 23 mg/dL   Creatinine, Ser 6.96  0.50 - 1.10 mg/dL   Calcium 29.5  8.4 - 28.4 mg/dL   Total Protein 7.8  6.0 - 8.3 g/dL   Albumin 4.4  3.5 - 5.2 g/dL   AST 26  0 - 37 U/L   ALT 14  0 - 35 U/L   Alkaline Phosphatase 78  39 - 117 U/L   Total Bilirubin 0.3  0.3 - 1.2 mg/dL   GFR calc non Af Amer >90  >90 mL/min   GFR calc Af Amer >90  >90 mL/min   Comment:            The eGFR has been calculated     using the CKD EPI equation.     This calculation has not been     validated in all  clinical     situations.     eGFR's persistently     <90 mL/min signify     possible Chronic Kidney Disease.  ETHANOL     Status: None   Collection Time    05/26/12  5:10 PM      Result Value Range   Alcohol, Ethyl (B) <11  0 - 11 mg/dL   Comment:  LOWEST DETECTABLE LIMIT FOR     SERUM ALCOHOL IS 11 mg/dL     FOR MEDICAL PURPOSES ONLY  PREGNANCY, URINE     Status: None   Collection Time    05/27/12  9:24 AM      Result Value Range   Preg Test, Ur NEGATIVE  NEGATIVE   Comment:            THE SENSITIVITY OF THIS     METHODOLOGY IS >20 mIU/mL.  URINALYSIS, ROUTINE W REFLEX MICROSCOPIC     Status: None   Collection Time    05/27/12  9:24 AM      Result Value Range   Color, Urine YELLOW  YELLOW   APPearance CLEAR  CLEAR   Specific Gravity, Urine 1.010  1.005 - 1.030   pH 7.5  5.0 - 8.0   Glucose, UA NEGATIVE  NEGATIVE mg/dL   Hgb urine dipstick NEGATIVE  NEGATIVE   Bilirubin Urine NEGATIVE  NEGATIVE   Ketones, ur NEGATIVE  NEGATIVE mg/dL   Protein, ur NEGATIVE  NEGATIVE mg/dL   Urobilinogen, UA 0.2  0.0 - 1.0 mg/dL   Nitrite NEGATIVE  NEGATIVE   Leukocytes, UA NEGATIVE  NEGATIVE   Comment: MICROSCOPIC NOT DONE ON URINES WITH NEGATIVE PROTEIN, BLOOD, LEUKOCYTES, NITRITE, OR GLUCOSE <1000 mg/dL.  URINE RAPID DRUG SCREEN (HOSP PERFORMED)     Status: None   Collection Time    05/27/12  9:24 AM      Result Value Range   Opiates NONE DETECTED  NONE DETECTED   Cocaine NONE DETECTED  NONE DETECTED   Benzodiazepines NONE DETECTED  NONE DETECTED   Amphetamines NONE DETECTED  NONE DETECTED   Tetrahydrocannabinol NONE DETECTED  NONE DETECTED   Barbiturates NONE DETECTED  NONE DETECTED   Comment:            DRUG SCREEN FOR MEDICAL PURPOSES     ONLY.  IF CONFIRMATION IS NEEDED     FOR ANY PURPOSE, NOTIFY LAB     WITHIN 5 DAYS.                LOWEST DETECTABLE LIMITS     FOR URINE DRUG SCREEN     Drug Class       Cutoff (ng/mL)     Amphetamine      1000      Barbiturate      200     Benzodiazepine   200     Tricyclics       300     Opiates          300     Cocaine          300     THC              50    Physical Findings: AIMS: Facial and Oral Movements Muscles of Facial Expression: None, normal Lips and Perioral Area: None, normal Jaw: None, normal Tongue: None, normal,Extremity Movements Upper (arms, wrists, hands, fingers): None, normal Lower (legs, knees, ankles, toes): None, normal, Trunk Movements Neck, shoulders, hips: None, normal, Overall Severity Severity of abnormal movements (highest score from questions above): None, normal Incapacitation due to abnormal movements: None, normal Patient's awareness of abnormal movements (rate only patient's report): No Awareness, Dental Status Current problems with teeth and/or dentures?: No Does patient usually wear dentures?: No  CIWA:    COWS:     Psychiatric Specialty Exam: See Psychiatric Specialty Exam and Suicide Risk Assessment completed  by Attending Physician prior to discharge.  Discharge destination:  Home  Is patient on multiple antipsychotic therapies at discharge:  No   Has Patient had three or more failed trials of antipsychotic monotherapy by history:  No Recommended Plan for Multiple Antipsychotic Therapies:  N/A  Discharge Orders   Future Orders Complete By Expires     Diet - low sodium heart healthy  As directed     Increase activity slowly  As directed         Medication List    STOP taking these medications       amphetamine-dextroamphetamine 20 MG tablet  Commonly known as:  ADDERALL     desvenlafaxine 50 MG 24 hr tablet  Commonly known as:  PRISTIQ     FRESHKOTE OP     hydrocortisone cream 1 %     LORazepam 0.5 MG tablet  Commonly known as:  ATIVAN      TAKE these medications     Indication   FLUoxetine 10 MG capsule  Commonly known as:  PROZAC  Take 1 capsule (10 mg total) by mouth daily.   Indication:  Major Depressive Disorder      lamoTRIgine 200 MG tablet  Commonly known as:  LAMICTAL  Take 1 tablet (200 mg total) by mouth 2 (two) times daily.   Indication:  Epilepsy     lamoTRIgine 200 MG tablet  Commonly known as:  LAMICTAL  Take 200 mg by mouth 2 (two) times daily.   Indication:  Depression, Epilepsy           Follow-up Information   Follow up with [facility] On 06/02/2012. (You are scheduled with Dr. Nolen Mu on Tuesday, June 02, 2012 at 1:30 PM)    Contact information:   [address] [place], [place]   [address]  [phone]      Follow-up recommendations:  Activity:  As tolerated Diet:  Low-sodium heart healthy diet  Comments:  Patient will continue her care with Emerson Monte  Total Discharge Time:  Greater than 30 minutes.  SignedNanine Means, PMH-NP 05/29/2012, 1:46 PM

## 2012-05-29 NOTE — Progress Notes (Signed)
[facility] Adult Case Management Discharge Plan :  Will you be returning to the same living situation after discharge: Yes,  Patient is returning to parents' home At discharge, do you have transportation home?:Yes,  Parents will transport patient home. Do you have the ability to pay for your medications:Yes,  Patient is able to afford medications.  Release of information consent forms completed and in the chart;  Patient's signature needed at discharge.  Patient to Follow up at: Follow-up Information   Follow up with [facility] On 06/02/2012. (You are scheduled with Dr. Nolen Mu on Tuesday, June 02, 2012 at 1:30 PM)    Contact information:   [address] [place], [place]   [address]  [phone]      Follow up with Dr. Gordy [facility] On 06/10/2012. (Wednesday, June 10, 2012 at 11:00 AM)    Contact information:   [address]  [address] [place],  [place]   [address]  [phone]      Patient denies SI/HI:   Yes,  Patient denies SI/HI and other thoughts of self harm.    Safety Planning and Suicide Prevention discussed:  Yes,  Reviewed during aftercare groups.  Wynn Banker 05/29/2012, 2:55 PM

## 2012-05-29 NOTE — [facility] Suicide Risk Assessment (Signed)
Suicide Risk Assessment  Discharge Assessment     Demographic Factors:  Female, caucasian, single  Mental Status Per Nursing Assessment::   On Admission:     Current Mental Status by Physician: Patient is alert and oriented to 4. Denies AH/VH/SI/HI.  Loss Factors: NA  Historical Factors: Family history of mental illness or substance abuse and Impulsivity  Risk Reduction Factors:   Living with another person, especially a relative and Positive social support  Continued Clinical Symptoms:  Depression:   Recent sense of peace/wellbeing  Cognitive Features That Contribute To Risk:  Closed-mindedness Thought constriction (tunnel vision)    Suicide Risk:  Minimal: No identifiable suicidal ideation.  Patients presenting with no risk factors but with morbid ruminations; may be classified as minimal risk based on the severity of the depressive symptoms  Discharge Diagnoses:   AXIS I:  Major Depression, Recurrent severe AXIS II:  Deferred AXIS III:   Past Medical History  Diagnosis Date  . Seizures     Petit mal  . Anxiety   . Depression   . Headache   . Colitis 2003   AXIS IV:  educational problems and other psychosocial or environmental problems AXIS V:  61-70 mild symptoms  Plan Of Care/Follow-up recommendations:  Activity:  as tolerated Diet:  regular Follow up with outpatient appointments.  Is patient on multiple antipsychotic therapies at discharge:  No   Has Patient had three or more failed trials of antipsychotic monotherapy by history:  No  Recommended Plan for Multiple Antipsychotic Therapies: NA  Abigail Lowery 05/29/2012, 1:11 PM

## 2012-06-02 ENCOUNTER — Other Ambulatory Visit (HOSPITAL_COMMUNITY): Payer: BC Managed Care – PPO | Attending: Psychiatry | Admitting: Psychiatry

## 2012-06-02 ENCOUNTER — Encounter (HOSPITAL_COMMUNITY): Payer: Self-pay

## 2012-06-02 DIAGNOSIS — F909 Attention-deficit hyperactivity disorder, unspecified type: Secondary | ICD-10-CM | POA: Insufficient documentation

## 2012-06-02 DIAGNOSIS — F411 Generalized anxiety disorder: Secondary | ICD-10-CM

## 2012-06-02 DIAGNOSIS — F32A Depression, unspecified: Secondary | ICD-10-CM

## 2012-06-02 DIAGNOSIS — F339 Major depressive disorder, recurrent, unspecified: Secondary | ICD-10-CM | POA: Insufficient documentation

## 2012-06-02 DIAGNOSIS — F331 Major depressive disorder, recurrent, moderate: Secondary | ICD-10-CM

## 2012-06-02 DIAGNOSIS — F410 Panic disorder [episodic paroxysmal anxiety] without agoraphobia: Secondary | ICD-10-CM

## 2012-06-02 DIAGNOSIS — F329 Major depressive disorder, single episode, unspecified: Secondary | ICD-10-CM

## 2012-06-02 MED ORDER — FLUOXETINE HCL 10 MG PO CAPS: 20.0000 mg | ORAL_CAPSULE | Freq: Every day | ORAL | Status: DC

## 2012-06-02 NOTE — Progress Notes (Signed)
Patient ID: Abigail Lowery, female   DOB: 10-26-1985, 27 y.o.   MRN: [id number] D:  Placed call to [facility], spoke to [place] who authorized 18 MH-IOP days with auth # [id number].

## 2012-06-02 NOTE — Progress Notes (Signed)
Psychiatric Assessment Adult  Patient Identification:  Abigail Lowery Date of Evaluation:  06/02/2012 Chief Complaint depression and anxiety.  History of Chief Complaint:  27 year old white single female transferred from inpatient unit after a four-day stay for depression anxiety panic attacks and suicidal ideation. : Pt was referred to [facility] by [facility] after patient came in and reported feeling depressed and suicidal. 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 She has been unable to concentrate, either can't sleep or oversleep, tearful, feeling worthless, lack of motivation, unable to complete or start tasks, and feeling inadequate. She reports it has affected her when her father talks down to her and says negative things about her. Pt reports a long history of cutting or burning herself and found herself cutting superficially two days ago to her wrists. She reports her suicidal thoughts are more frequent and was unable to contract for safety.  " Patient's  failing a class at school last semester and has "been going down since." She has been experiencing panic attacks and has been missing classes. Patient reported having suicidal thoughts but never a specific plan. She reports never being diagnosed with Bipolar but in high school often had manic symptoms. She reports these nearly subsided when she was started on Lamictal for a seizure disorder. The patient reports a very mild seizure three days ago after missing a dose of Lamictal. Patient is concerned about not being on Adderrall while in the hospital as "I might just fall asleep. But at least I'm not driving while I'm here." Patient much calmer after speaking with provider. She agrees to attend groups and spend her time here at [facility] productively.  During hospitalization, MD managed her medications: Adderall, ativan, and Pristiq discontinued. Prozac 10 mg started for depression. Lamictal for her seizure disorder  continued. Delmi's depression and anxiety decreased, feels her depression was "clouding her mind" and cause her not to be able to concentrate. Joni Reining remains in denial about her eating disorder by stating she can't eat the food here because she is vegetarian and she needs her Adderall to increase her appetite, despite education that it decreases the appetite. Cluster B traits with manipulation and splitting of staff and family to get what she wants. Patient denied suicidal/homicidal ideations and auditory/visual hallucinations, follow-up appointments encouraged to attend, Rx given. She will continue her care at [facility] and with Emerson Monte. Sherisa is stable mentally and physically for discharge.   Patient states that she has restarted her Adderall as she is fearful that she'll fall asleep while driving because of her narcolepsy. She has an appointment with Dr. Emerson Monte today and it was discussed with her that I would like to discontinue the Adderall and switch her to Dexedrine which also has some antiseizure properties she stated that she would convey this to Dr. Nolen Mu. Also discussed increasing Prozac 20 mg every day.     Chief Complaint  Patient presents with  . Depression  . Panic Attack  . Stress    HPI Review of Systems Physical Exam  Depressive Symptoms: depressed mood, insomnia, feelings of worthlessness/guilt, difficulty concentrating, impaired memory, anxiety, decreased appetite,  (Hypo) Manic Symptoms:  None  Anxiety Symptoms: Excessive Worry:  Yes Panic Symptoms:  Yes Agoraphobia:  No Obsessive Compulsive: No  Symptoms: None, Specific Phobias:  No Social Anxiety:  No  Psychotic Symptoms:  Hallucinations: No None Delusions:  No Paranoia:  No   Ideas of Reference:  No  PTSD  Symptoms: None   Traumatic Brain Injury: No   Past Psychiatric History: Diagnosis: Depression, ADHD, anxiety disorder   Hospitalizations: March 2 014   Outpatient Care:  Dr. Emerson Monte for psychiatry and Dr. Felicie Morn for psychotherapy. Dr. Epimenio Foot is her neurologist   Substance Abuse Care:   Self-Mutilation: History of cutting   Suicidal Attempts:   Violent Behaviors:    Past Medical History:   Past Medical History  Diagnosis Date  . Seizures     Petit mal  . Anxiety   . Depression   . Headache   . Colitis 2003  . Narcolepsy   . Epilepsy   . Migraines   . Hyperlipidemia    History of Loss of Consciousness:  No Seizure History:  Yes Cardiac History:  No Allergies:   Allergies  Allergen Reactions  . Dust Mite Extract   . Mold Extract (Trichophyton)    Current Medications:  Current Outpatient Prescriptions  Medication Sig Dispense Refill  . FLUoxetine (PROZAC) 10 MG capsule Take 1 capsule (10 mg total) by mouth daily.  30 capsule  1  . lamoTRIgine (LAMICTAL) 200 MG tablet Take 200 mg by mouth 2 (two) times daily.       No current facility-administered medications for this visit.    Previous Psychotropic Medications:  Medication Dose  Pristiq                       Substance Abuse History in the last 12 months: Not applicable Substance Age of 1st Use Last Use Amount Specific Type  Nicotine      Alcohol      Cannabis      Opiates      Cocaine      Methamphetamines      LSD      Ecstasy      Benzodiazepines      Caffeine      Inhalants      Others:                          Medical Consequences of Substance Abuse: na  Legal Consequences of Substance Abuse: na  Family Consequences of Substance Abuse: na  Blackouts:  No DT's:  No Withdrawal Symptoms:  No None  Social History: Current Place of Residence: Lives with parents in [place] Place of Birth:  Family Members:  Marital Status:  Single Children: 0  Sons:   Daughters:  Relationships:  Education:  Corporate treasurer Problems/Performance: Patient is struggling in school at [employer]. Religious Beliefs/Practices:  History of Abuse: none Occupational  Experiences; Military History:  None. Legal History: None Hobbies/Interests:   Family History:   Family History  Problem Relation Age of Onset  . Depression Mother   . Anxiety disorder Mother   . Depression Father   . Anxiety disorder Maternal Aunt   . Depression Maternal Aunt   . Bipolar disorder Maternal Uncle   . Schizophrenia Maternal Grandfather   . Bipolar disorder Cousin     Mental Status Examination/Evaluation: Objective:  Appearance: Casual  Eye Contact::  Fair  Speech:  Normal Rate  Volume:  Normal  Mood:  Depressed and anxious   Affect:  Constricted  Thought Process:  Goal Directed and Linear  Orientation:  Full (Time, Place, and Person)  Thought Content:  Rumination  Suicidal Thoughts:  No  Homicidal Thoughts:  No  Judgement:  Fair  Insight:  Fair  Psychomotor Activity:  Normal  Akathisia:  No  Handed:  Right  AIMS (if indicated):  0  Assets:  Communication Skills Desire for Improvement Resilience Social Support    Laboratory/X-Ray Psychological Evaluation(s)   Labs done on the inpatient unit      Assessment:  Axis I: Major Depression, Recurrent severe  AXIS I ADHD, combined type, Anxiety Disorder NOS and Major Depression, Recurrent severe  AXIS II Cluster B Traits  AXIS III Past Medical History  Diagnosis Date  . Seizures     Petit mal  . Anxiety   . Depression   . Headache   . Colitis 2003  . Narcolepsy   . Epilepsy   . Migraines   . Hyperlipidemia      AXIS IV educational problems, housing problems, other psychosocial or environmental problems, problems related to social environment and problems with primary support group  AXIS V 51-60 moderate symptoms   Treatment Plan/Recommendations:  Plan of Care: Start IOP   Laboratory:  Done on the inpatient unit  Psychotherapy: Group and individual therapy   Medications: Patient will be continued on her Lamictal 200 twice a day, increase Prozac 20 mg every day. Patient would like to restart  the Adderall discussed that I would like to discontinue Adderall and start Dexedrine patient wants to discuss this with her psychiatrist Dr. Nolen Mu.   Routine PRN Medications:  Yes  Consultations:   Safety Concerns:  None   Other:  NOS 2 weeks     Margit Banda, MD 4/1/201412:14 PM

## 2012-06-02 NOTE — Progress Notes (Signed)
Patient ID: Abigail Lowery, female   DOB: 28-Jun-1985, 28 y.o.   MRN: [id number] D:  This is a 27 yo single caucasian female, who was transitioned from the inpatient unit.  Pt was admitted due to depressive and anxiety symptoms (panic attacks) with SI.  Long hx of of self injurious behaviors (cutting).  Admits to superficially cutting her left wrist ~ one week ago.  Discussed safety options with pt.  Pt is able to contract for safety.  States that her symptoms worsened ~ two months ago.  Admits to running out of a medication (Prestique) ~ one month ago.  Stressors:  1)  Doctor, general practice ([employer]:  Major is Nutrition)  States she is doing poorly in school.  2)  Job [employer]) of 3 years, where she works part-time.  3)  Financial Strain  4)  Conflictual Relationship with father.  Pt currently resides at home with parents.  States there is a lot of tension there.  "There is a lot of yelling.  My parents are very concerned about me because they know I'm not doing well." Childhood:  "Amazing.  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  Dx with ADHD at age 68.  Loved school and learning.  Dx with Narcolepsy in 2009.  Denies any trauma or abuse. Siblings:  A brother in [place] and a sister in [place].  Pt states she's the middle child. Pt denies any drugs/ETOH.  Denies any cigarettes. Support system includes her parents and siblings. Pt completed all forms.  Scored 37 on the burns.  Pt will attend [facility] for two weeks.  A:  Oriented pt.  Provided pt with an orientation folder.  Informed Dr. Nolen Mu, Dr. Marcina Millard, and Curlene Labrum, [facility] of admit.  Encouraged support groups.  R:  Pt receptive.

## 2012-06-03 ENCOUNTER — Other Ambulatory Visit (HOSPITAL_COMMUNITY): Payer: BC Managed Care – PPO | Admitting: Psychiatry

## 2012-06-03 DIAGNOSIS — F411 Generalized anxiety disorder: Secondary | ICD-10-CM

## 2012-06-03 DIAGNOSIS — F329 Major depressive disorder, single episode, unspecified: Secondary | ICD-10-CM

## 2012-06-03 DIAGNOSIS — F32A Depression, unspecified: Secondary | ICD-10-CM

## 2012-06-03 NOTE — Progress Notes (Signed)
    Daily Group Progress Note  Program: IOP  Group Time: 9:00-10:30 am   Participation Level: None  Behavioral Response: none  Type of Therapy:  Process Group  Summary of Progress: Patient arrived ten minutes before group ended. She stated she "overslept" and woke up when she received a phone call from the case manger inquiring as to where she was.      Group Time: 10:30 am - 12:00 pm   Participation Level:  Active  Behavioral Response: Appropriate  Type of Therapy: Psycho-education Group  Summary of Progress: Patient participated in a discussion on support groups available through the [facility] that can be continued as part of patients wellness plan.   Carman Ching, LCSW

## 2012-06-03 NOTE — Progress Notes (Signed)
    Daily Group Progress Note  Program: IOP  Group Time: 9:00-10:30 am   Participation Level: Active  Behavioral Response: Appropriate  Type of Therapy:  Process Group  Summary of Progress: Today was patients first day in the group. She was attentive and observed the group process.      Group Time: 10:30 am - 12:00 pm   Participation Level:  Active  Behavioral Response: Appropriate  Type of Therapy: Psycho-education Group  Summary of Progress: Patient learned how to use the anxiety management tool of Biofeedback to manage depression symptoms.   Carman Ching, LCSW

## 2012-06-03 NOTE — Progress Notes (Signed)
Patient Discharge Instructions:  After Visit Summary (AVS):   Faxed to:  06/03/12 Discharge Summary Note:   Faxed to:  06/03/12 Psychiatric Admission Assessment Note:   Faxed to:  06/03/12 Suicide Risk Assessment - Discharge Assessment:   Faxed to:  06/03/12 Faxed/Sent to the Next Level Care provider:  06/03/12 Faxed to [facility] @[phone] Faxed to [facility] @ [phone]   Jerelene Redden, 06/03/2012, 1:52 PM

## 2012-06-04 ENCOUNTER — Other Ambulatory Visit (HOSPITAL_COMMUNITY): Payer: BC Managed Care – PPO | Admitting: Psychiatry

## 2012-06-04 DIAGNOSIS — F329 Major depressive disorder, single episode, unspecified: Secondary | ICD-10-CM

## 2012-06-04 DIAGNOSIS — F32A Depression, unspecified: Secondary | ICD-10-CM

## 2012-06-04 DIAGNOSIS — F411 Generalized anxiety disorder: Secondary | ICD-10-CM

## 2012-06-05 ENCOUNTER — Other Ambulatory Visit (HOSPITAL_COMMUNITY): Payer: BC Managed Care – PPO | Admitting: Psychiatry

## 2012-06-05 NOTE — Patient Instructions (Signed)
Patient will be discharged today.  Follow up with [facility] for groups.  Encouraged support groups.  Also, follow up with Dr. Nolen Mu, Dr. Marcina Millard and Curlene Labrum, [facility] at [facility].

## 2012-06-05 NOTE — Progress Notes (Signed)
    Daily Group Progress Note  Program: IOP  Group Time: 9:00-10:30 am   Participation Level: Minimal  Behavioral Response: Appropriate  Type of Therapy:  Process Group  Summary of Progress: Patient arrived forty minutes late to group. This is the second day in a row she was late. Writer met individually with patient to problem solve barriers to arrival on time. Patient states she "has always struggled with waking up in the morning" and could not identify ways to improve this behavior. Patient was encouraged to think about how writer could help patient with this due to needing to arrive on time. Patient came up with a plan to arrive on time tomorrow that involved her alarm clock and having her mother call her on the phone. Patient was encouraged to think about how her tardiness is impacting her school, family and soon to be career life.      Group Time: 10:30 am - 12:00 pm   Participation Level:  Active  Behavioral Response: Appropriate  Type of Therapy: Psycho-education Group  Summary of Progress: Patient created their discharge wellness plan and identified supportive services and coping skills they will use to manage wellness after ending the program.   Maxcine Ham E, LCSW

## 2012-06-05 NOTE — Progress Notes (Signed)
Discharge Note  Patient:  Abigail Lowery is an 27 y.o., female DOB:  09-09-1985  Date of Admissionv  4 /1/14  Date of Discharge:  06/05/12  Reason for Admission: Patient was transferred from the inpatient unit where she had been admitted for suicidal ideation panic attacks and depression  Hospital Course: Patient started IOP and was continued on her medications from the inpatient unit which included Lamictal 200 mg twice a day and Prozac was increased to 20 mg every day. Her Adderall had been discontinued on the inpatient unit as well as her Ativan. Patient started IOP , but would come extremely late every day and that's missing most of the first group. Patient went and saw Dr. Emerson Monte and stated that Dr. Nolen Mu wanted her to continue the Adderall instead of the Dexedrine. It was discussed with the patient that she needed to be on time and if that was not possible then she would be referred to an outpatient therapist. Patient continued to her I've almost an hour late to group and so it was decided to discharge her.  Mental Status at Discharge: Alert, oriented x3, affect was bright mood was mildly anxious, no suicidal or homicidal ideation. No hallucinations or delusions. Speech was normal, recent and remote memory was good, judgment and insight were good, concentration was poor recall was fair.  Lab Results: No results found for this or any previous visit (from the past 48 hour(s)).  Current outpatient prescriptions:FLUoxetine (PROZAC) 10 MG capsule, Take 2 capsules (20 mg total) by mouth daily., Disp: 30 capsule, Rfl: 1;  lamoTRIgine (LAMICTAL) 200 MG tablet, Take 200 mg by mouth 2 (two) times daily., Disp: , Rfl:   Axis Diagnosis:   Axis I: ADHD, combined type, Anxiety Disorder NOS and Major depression recurrent. Axis II: Deferred Axis III:  Past Medical History  Diagnosis Date  . Seizures     Petit mal  . Anxiety   . Depression   . Headache   . Colitis 2003  . Narcolepsy    . Epilepsy   . Migraines   . Hyperlipidemia    Axis IV: educational problems, housing problems, other psychosocial or environmental problems, problems related to social environment and problems with primary support group Axis V: 61-70 mild symptoms   Level of Care:  OP  Discharge destination:  Home  Is patient on multiple antipsychotic therapies at discharge:  No    Has Patient had three or more failed trials of antipsychotic monotherapy by history:  No  Patient phone:  [phone] (home)  Patient address:   [address] [place] [address],   Follow-up recommendations:  Activity:  As tolerated Diet:  Regular Other:  Followup with Dr. Andee Poles for medications and Dr. Felicie Morn for therapyy  Comments:  Patient will contact with Verdon Cummins at [facility].  The patient received suicide prevention pamphlet:  Yes  Margit Banda 06/05/2012, 12:26 PM

## 2012-06-05 NOTE — Progress Notes (Signed)
    Daily Group Progress Note  Program: IOP  Group Time: 9:00-10:30 am   Participation Level: None  Behavioral Response: none  Type of Therapy:  Process Group  Summary of Progress: Patient arrived fifty minutes late today, making that three days in a row she was late. Patient states she did not follow the determined plan from yesterday to help her wake up and attend group on time. She states she set one alarm clock and said that it must have been "miset" because it did not go off. Patient participated in a treatment team meeting with Clinical research associate, case manager and doctor to begin the discharge process due to noncompliance.      Group Time: 10:30 am - 12:00 pm   Participation Level:  None  Behavioral Response: none  Type of Therapy: Discharge meeting  Summary of Progress: Patient completed discharge procedures to end the program today due to noncompliance.   Carman Ching, LCSW

## 2012-06-05 NOTE — Progress Notes (Signed)
Patient ID: Abigail Lowery, female   DOB: 1985/11/03, 27 y.o.   MRN: [id number] D:  This is a 27 yo single caucasian female, who was transitioned from the inpatient unit. Pt was admitted due to depressive and anxiety symptoms (panic attacks) with SI. Long hx of of self injurious behaviors (cutting). Admits to superficially cutting her left wrist ~ one week ago. Discussed safety options with pt. Pt is able to contract for safety.   Denies any HI or A/V hallucinations.  States that her symptoms worsened ~ two months ago. Admits to running out of a medication (Prestique) ~ one month ago. Stressors: 1) Doctor, general practice ([employer]: Major is Nutrition) States she is doing poorly in school. 2) Job [employer]) of 3 years, where she works part-time. 3) Financial Strain 4) Conflictual Relationship with father. Pt currently resides at home with parents. States there is a lot of tension there. "There is a lot of yelling. My parents are very concerned about me because they know I'm not doing well." Pt has been having difficulty with arriving to [facility] on time each morning.  Has been late since starting the program.  A:  D/C pt today for her non-compliancy with being on time for group.  Encouraged pt to f/u with [facility] for groups, Curlene Labrum, [facility]), Dr. Marcina Millard, and Dr. Nolen Mu.  R:  Pt receptive.

## 2012-06-08 ENCOUNTER — Other Ambulatory Visit (HOSPITAL_COMMUNITY): Payer: BC Managed Care – PPO

## 2012-06-08 ENCOUNTER — Telehealth (HOSPITAL_COMMUNITY): Payer: Self-pay

## 2012-06-08 NOTE — Telephone Encounter (Signed)
8:11am 06/08/12 - Returned call - pt's mother left a phone msg on 04/04 @ 6:21pm for someone to call her back. - waiting for call back./sh

## 2012-06-09 ENCOUNTER — Other Ambulatory Visit (HOSPITAL_COMMUNITY): Payer: BC Managed Care – PPO

## 2012-06-10 ENCOUNTER — Other Ambulatory Visit (HOSPITAL_COMMUNITY): Payer: BC Managed Care – PPO

## 2012-06-11 ENCOUNTER — Other Ambulatory Visit (HOSPITAL_COMMUNITY): Payer: BC Managed Care – PPO

## 2012-06-12 ENCOUNTER — Other Ambulatory Visit (HOSPITAL_COMMUNITY): Payer: BC Managed Care – PPO

## 2012-06-15 ENCOUNTER — Other Ambulatory Visit (HOSPITAL_COMMUNITY): Payer: BC Managed Care – PPO

## 2012-06-16 ENCOUNTER — Other Ambulatory Visit (HOSPITAL_COMMUNITY): Payer: BC Managed Care – PPO

## 2012-06-17 ENCOUNTER — Other Ambulatory Visit (HOSPITAL_COMMUNITY): Payer: BC Managed Care – PPO

## 2012-06-18 ENCOUNTER — Other Ambulatory Visit (HOSPITAL_COMMUNITY): Payer: BC Managed Care – PPO

## 2012-06-19 ENCOUNTER — Other Ambulatory Visit (HOSPITAL_COMMUNITY): Payer: BC Managed Care – PPO

## 2012-06-22 ENCOUNTER — Other Ambulatory Visit (HOSPITAL_COMMUNITY): Payer: BC Managed Care – PPO

## 2012-06-23 ENCOUNTER — Other Ambulatory Visit (HOSPITAL_COMMUNITY): Payer: BC Managed Care – PPO

## 2013-01-07 ENCOUNTER — Other Ambulatory Visit: Payer: Self-pay

## 2013-05-03 ENCOUNTER — Emergency Department (HOSPITAL_COMMUNITY)
Admission: EM | Admit: 2013-05-03 | Discharge: 2013-05-03 | Disposition: A | Discharge status: Home / Self Care | Payer: Self-pay | Attending: Emergency Medicine | Admitting: Emergency Medicine

## 2013-05-03 ENCOUNTER — Encounter (HOSPITAL_COMMUNITY): Payer: Self-pay | Admitting: Emergency Medicine

## 2013-05-03 ENCOUNTER — Emergency Department (HOSPITAL_COMMUNITY): Payer: No Typology Code available for payment source

## 2013-05-03 DIAGNOSIS — F3289 Other specified depressive episodes: Secondary | ICD-10-CM | POA: Insufficient documentation

## 2013-05-03 DIAGNOSIS — N390 Urinary tract infection, site not specified: Secondary | ICD-10-CM | POA: Insufficient documentation

## 2013-05-03 DIAGNOSIS — G40909 Epilepsy, unspecified, not intractable, without status epilepticus: Secondary | ICD-10-CM | POA: Insufficient documentation

## 2013-05-03 DIAGNOSIS — Z8719 Personal history of other diseases of the digestive system: Secondary | ICD-10-CM | POA: Insufficient documentation

## 2013-05-03 DIAGNOSIS — R110 Nausea: Secondary | ICD-10-CM | POA: Insufficient documentation

## 2013-05-03 DIAGNOSIS — R1031 Right lower quadrant pain: Secondary | ICD-10-CM

## 2013-05-03 DIAGNOSIS — G43909 Migraine, unspecified, not intractable, without status migrainosus: Secondary | ICD-10-CM | POA: Insufficient documentation

## 2013-05-03 DIAGNOSIS — F329 Major depressive disorder, single episode, unspecified: Secondary | ICD-10-CM | POA: Insufficient documentation

## 2013-05-03 DIAGNOSIS — Z79899 Other long term (current) drug therapy: Secondary | ICD-10-CM | POA: Insufficient documentation

## 2013-05-03 DIAGNOSIS — Z8639 Personal history of other endocrine, nutritional and metabolic disease: Secondary | ICD-10-CM | POA: Insufficient documentation

## 2013-05-03 DIAGNOSIS — Z862 Personal history of diseases of the blood and blood-forming organs and certain disorders involving the immune mechanism: Secondary | ICD-10-CM | POA: Insufficient documentation

## 2013-05-03 DIAGNOSIS — F411 Generalized anxiety disorder: Secondary | ICD-10-CM | POA: Insufficient documentation

## 2013-05-03 DIAGNOSIS — Z3202 Encounter for pregnancy test, result negative: Secondary | ICD-10-CM | POA: Insufficient documentation

## 2013-05-03 LAB — COMPREHENSIVE METABOLIC PANEL
ALT: 13 U/L (ref 0–35)
AST: 21 U/L (ref 0–37)
Albumin: 4.1 g/dL (ref 3.5–5.2)
Alkaline Phosphatase: 82 U/L (ref 39–117)
BUN: 11 mg/dL (ref 6–23)
CALCIUM: 9.8 mg/dL (ref 8.4–10.5)
CO2: 28 mEq/L (ref 19–32)
CREATININE: 0.69 mg/dL (ref 0.50–1.10)
Chloride: 96 mEq/L (ref 96–112)
GFR calc Af Amer: >90 mL/min (ref >90)
GFR calc non Af Amer: >90 mL/min (ref >90)
Glucose, Bld: 111 mg/dL — ABNORMAL HIGH (ref 70–99)
Potassium: 4.4 mEq/L (ref 3.7–5.3)
Sodium: 137 mEq/L (ref 137–147)
TOTAL PROTEIN: 7.3 g/dL (ref 6.0–8.3)
Total Bilirubin: 0.6 mg/dL (ref 0.3–1.2)

## 2013-05-03 LAB — URINE MICROSCOPIC-ADD ON

## 2013-05-03 LAB — CBC WITH DIFFERENTIAL/PLATELET
BASOS PCT: 0 % (ref 0–1)
Basophils Absolute: 0 10*3/uL (ref 0.0–0.1)
EOS ABS: 0 10*3/uL (ref 0.0–0.7)
Eosinophils Relative: 0 % (ref 0–5)
HCT: 36.5 % (ref 36.0–46.0)
HEMOGLOBIN: 12.3 g/dL (ref 12.0–15.0)
Lymphocytes Relative: 7 % — ABNORMAL LOW (ref 12–46)
Lymphs Abs: 0.8 10*3/uL (ref 0.7–4.0)
MCH: 30.9 pg (ref 26.0–34.0)
MCHC: 33.7 g/dL (ref 30.0–36.0)
MCV: 91.7 fL (ref 78.0–100.0)
MONOS PCT: 7 % (ref 3–12)
Monocytes Absolute: 0.8 10*3/uL (ref 0.1–1.0)
NEUTROS PCT: 85 % — AB (ref 43–77)
Neutro Abs: 8.7 10*3/uL — ABNORMAL HIGH (ref 1.7–7.7)
Platelets: 200 10*3/uL (ref 150–400)
RBC: 3.98 MIL/uL (ref 3.87–5.11)
RDW: 13 % (ref 11.5–15.5)
WBC: 10.3 10*3/uL (ref 4.0–10.5)

## 2013-05-03 LAB — URINALYSIS, ROUTINE W REFLEX MICROSCOPIC
Bilirubin Urine: NEGATIVE
Glucose, UA: NEGATIVE mg/dL
Ketones, ur: NEGATIVE mg/dL
Nitrite: NEGATIVE
Protein, ur: NEGATIVE mg/dL
SPECIFIC GRAVITY, URINE: 1.011 (ref 1.005–1.030)
UROBILINOGEN UA: 0.2 mg/dL (ref 0.0–1.0)
pH: 8 (ref 5.0–8.0)

## 2013-05-03 LAB — WET PREP, GENITAL
Clue Cells Wet Prep HPF POC: NONE SEEN
Trich, Wet Prep: NONE SEEN
Yeast Wet Prep HPF POC: NONE SEEN

## 2013-05-03 LAB — POC URINE PREG, ED: PREG TEST UR: NEGATIVE

## 2013-05-03 LAB — LIPASE, BLOOD: LIPASE: 27 U/L (ref 11–59)

## 2013-05-03 MED ORDER — CIPROFLOXACIN HCL 500 MG PO TABS: 500.0000 mg | ORAL_TABLET | Freq: Two times a day (BID) | ORAL | Status: DC

## 2013-05-03 MED ORDER — IOHEXOL 300 MG/ML  SOLN
50.0000 mL | Freq: Once | INTRAMUSCULAR | Status: AC | PRN
  Administered 2013-05-03: 50 mL via ORAL

## 2013-05-03 MED ORDER — HYDROCODONE-ACETAMINOPHEN 5-325 MG PO TABS: 1.0000 | ORAL_TABLET | Freq: Four times a day (QID) | ORAL | Status: DC | PRN

## 2013-05-03 MED ORDER — MORPHINE SULFATE 4 MG/ML IJ SOLN
4.0000 mg | Freq: Once | INTRAMUSCULAR | Status: AC
  Administered 2013-05-03: 4 mg via INTRAVENOUS
  Filled 2013-05-03: qty 1

## 2013-05-03 MED ORDER — CIPROFLOXACIN HCL 500 MG PO TABS
500.0000 mg | ORAL_TABLET | Freq: Once | ORAL | Status: AC
  Administered 2013-05-03: 500 mg via ORAL
  Filled 2013-05-03: qty 1

## 2013-05-03 MED ORDER — SODIUM CHLORIDE 0.9 % IV SOLN
Freq: Once | INTRAVENOUS | Status: AC
  Administered 2013-05-03: 125 mL/h via INTRAVENOUS

## 2013-05-03 MED ORDER — KETOROLAC TROMETHAMINE 15 MG/ML IJ SOLN
30.0000 mg | Freq: Once | INTRAMUSCULAR | Status: AC
  Administered 2013-05-03: 30 mg via INTRAVENOUS
  Filled 2013-05-03: qty 2

## 2013-05-03 MED ORDER — PROMETHAZINE HCL 25 MG PO TABS: 25.0000 mg | ORAL_TABLET | Freq: Four times a day (QID) | ORAL | Status: DC | PRN

## 2013-05-03 MED ORDER — IOHEXOL 300 MG/ML  SOLN
80.0000 mL | Freq: Once | INTRAMUSCULAR | Status: AC | PRN
  Administered 2013-05-03: 80 mL via INTRAVENOUS

## 2013-05-03 MED ORDER — ACETAMINOPHEN 325 MG PO TABS
650.0000 mg | ORAL_TABLET | Freq: Once | ORAL | Status: AC
  Administered 2013-05-03: 650 mg via ORAL
  Filled 2013-05-03: qty 2

## 2013-05-03 MED ORDER — ONDANSETRON HCL 4 MG/2ML IJ SOLN
4.0000 mg | Freq: Once | INTRAMUSCULAR | Status: AC
  Administered 2013-05-03: 4 mg via INTRAVENOUS
  Filled 2013-05-03: qty 2

## 2013-05-03 MED ORDER — IBUPROFEN 400 MG PO TABS: 400.0000 mg | ORAL_TABLET | Freq: Four times a day (QID) | ORAL | Status: DC | PRN

## 2013-05-03 NOTE — ED Provider Notes (Signed)
CSN: [id number]     Arrival date & time 05/03/13  1309 History   First MD Initiated Contact with Patient 05/03/13 1430     Chief Complaint  Patient presents with  . Abdominal Pain     (Consider location/radiation/quality/duration/timing/severity/associated sxs/prior Treatment) HPI Abigail Lowery is a 28 y.o. female who presents to ED with complaint of abdominal pain. Patient states that pain began yesterday. She states that pain is in the right of her belly button. States has been constant from yesterday. Reports associated nausea. Patient reports movement makes pain worse. She has not taken any medications for it. She called her doctor today but was sent to emergency department and with concern for appendicitis. She denies any prior abdominal surgeries. She denies any urinary symptoms. She denies any vaginal discharge or bleeding. She reports she's not pregnant. She reports a fever of 99.7 at home. She states she does not have appetite due to nausea. No other complaints   Past Medical History  Diagnosis Date  . Seizures     Petit mal  . Anxiety   . Depression   . Headache(784.0)   . Colitis 2003  . Narcolepsy   . Epilepsy   . Migraines   . Hyperlipidemia    Past Surgical History  Procedure Laterality Date  . Adenoidectomy  1998  . Wrist surgery Right 2004  . Bilateral hip arthroscopy  2011, 2012   Family History  Problem Relation Age of Onset  . Depression Mother   . Anxiety disorder Mother   . Depression Father   . Anxiety disorder Maternal Aunt   . Depression Maternal Aunt   . Bipolar disorder Maternal Uncle   . Schizophrenia Maternal Grandfather   . Bipolar disorder Cousin    History  Substance Use Topics  . Smoking status: Passive Smoke Exposure - Never Smoker  . Smokeless tobacco: Never Used  . Alcohol Use: 0.6 oz/week    1 Glasses of wine per week   OB History   Grav Para Term Preterm Abortions TAB SAB Ect Mult Living                 Review of Systems   Constitutional: Positive for fever, chills and appetite change.  Respiratory: Negative for cough, chest tightness and shortness of breath.   Cardiovascular: Negative for chest pain, palpitations and leg swelling.  Gastrointestinal: Positive for nausea and abdominal pain. Negative for vomiting and diarrhea.  Genitourinary: Negative for dysuria, flank pain, vaginal bleeding, vaginal discharge, vaginal pain and pelvic pain.  Musculoskeletal: Negative for arthralgias, myalgias, neck pain and neck stiffness.  Skin: Negative for rash.  Neurological: Negative for dizziness, weakness and headaches.  All other systems reviewed and are negative.      Allergies  Dust mite extract and Mold extract  Home Medications   Current Outpatient Rx  Name  Route  Sig  Dispense  Refill  . acetaminophen (TYLENOL) 500 MG tablet   Oral   Take 500 mg by mouth once.         [name] amphetamine-dextroamphetamine (ADDERALL) 20 MG tablet   Oral   Take 20-40 mg by mouth 3 (three) times daily. Take 40 mg every morning, 20 mg every day at noon, and 20 mg (if needed) early evening for narcolepsy.         [name] FLUoxetine (PROZAC) 40 MG capsule   Oral   Take 40 mg by mouth daily.         [name] lamoTRIgine (LAMICTAL) 200  MG tablet   Oral   Take 200 mg by mouth 2 (two) times daily.         [name]. LORazepam (ATIVAN) 0.5 MG tablet   Oral   Take 0.5 mg by mouth every 8 (eight) hours as needed for anxiety.          BP 123/74  Pulse 116  Temp(Src) 99 F (37.2 C) (Oral)  Resp 18  SpO2 100%  LMP 04/15/2013 Physical Exam  Nursing note and vitals reviewed. Constitutional: She appears well-developed and well-nourished. No distress.  HENT:  Head: Normocephalic.  Eyes: Conjunctivae are normal.  Neck: Neck supple.  Cardiovascular: Normal rate, regular rhythm and normal heart sounds.   Pulmonary/Chest: Effort normal and breath sounds normal. No respiratory distress. She has no wheezes. She has no rales.  Abdominal:  Soft. Bowel sounds are normal. She exhibits no distension. There is tenderness. There is no rebound.  RLQ and right periumbilical tenderness  Genitourinary:  Normal external genitalia. Thin white vaginal discharge. Normal cervix. No CMT. No uterine or adnexal tenderness.   Musculoskeletal: She exhibits no edema.  Neurological: She is alert.  Skin: Skin is warm and dry.  Psychiatric: She has a normal mood and affect. Her behavior is normal.    ED Course  Procedures (including critical care time) Labs Review Labs Reviewed  WET PREP, GENITAL - Abnormal; Notable for the following:    WBC, Wet Prep HPF POC FEW (*)    All other components within normal limits  CBC WITH DIFFERENTIAL - Abnormal; Notable for the following:    Neutrophils Relative % 85 (*)    Neutro Abs 8.7 (*)    Lymphocytes Relative 7 (*)    All other components within normal limits  COMPREHENSIVE METABOLIC PANEL - Abnormal; Notable for the following:    Glucose, Bld 111 (*)    All other components within normal limits  URINALYSIS, ROUTINE W REFLEX MICROSCOPIC - Abnormal; Notable for the following:    APPearance CLOUDY (*)    Hgb urine dipstick SMALL (*)    Leukocytes, UA SMALL (*)    All other components within normal limits  GC/CHLAMYDIA PROBE AMP  LIPASE, BLOOD  URINE MICROSCOPIC-ADD ON  POC URINE PREG, ED   Imaging Review No results found.   EKG Interpretation None      MDM   Final diagnoses:  None    Patient with right lower abdominal pain since yesterday, low-grade fever, anorexia, nausea. Patient has history of colitis. Also cannot rule out appendicitis given exam finding. Pelvic exam is unremarkable. Will get CT scan for further evaluation.  Patient signed out at shift change with PA and pending CT scan. Patient's pain improved with IV morphine and Zofran. She is recieiving IV fluids. Pt is tachycardic, otherwise normal VS. Will recheck.    Lottie Musselatyana A Merlene Dante, PA-C 05/03/13 1646  Lottie Musselatyana A  Ogle Hoeffner, PA-C 05/03/13 1647

## 2013-05-03 NOTE — ED Notes (Signed)
Pt reports RLQ abdominal pain, sts radiates to her back, pt sts she called her PCP for an appointment and was told to come to ed to role out appendicitis. Pt also reports nausea.

## 2013-05-03 NOTE — Discharge Instructions (Signed)
Abdominal Pain, Women  Abdominal (stomach, pelvic, or belly) pain can be caused by many things. It is important to tell your doctor:   The location of the pain.   Does it come and go or is it present all the time?   Are there things that start the pain (eating certain foods, exercise)?   Are there other symptoms associated with the pain (fever, nausea, vomiting, diarrhea)?  All of this is helpful to know when trying to find the cause of the pain.  CAUSES    Stomach: virus or bacteria infection, or ulcer.   Intestine: appendicitis (inflamed appendix), regional ileitis (Crohn's disease), ulcerative colitis (inflamed colon), irritable bowel syndrome, diverticulitis (inflamed diverticulum of the colon), or cancer of the stomach or intestine.   Gallbladder disease or stones in the gallbladder.   Kidney disease, kidney stones, or infection.   Pancreas infection or cancer.   Fibromyalgia (pain disorder).   Diseases of the female organs:   Uterus: fibroid (non-cancerous) tumors or infection.   Fallopian tubes: infection or tubal pregnancy.   Ovary: cysts or tumors.   Pelvic adhesions (scar tissue).   Endometriosis (uterus lining tissue growing in the pelvis and on the pelvic organs).   Pelvic congestion syndrome (female organs filling up with blood just before the menstrual period).   Pain with the menstrual period.   Pain with ovulation (producing an egg).   Pain with an IUD (intrauterine device, birth control) in the uterus.   Cancer of the female organs.   Functional pain (pain not caused by a disease, may improve without treatment).   Psychological pain.   Depression.  DIAGNOSIS   Your doctor will decide the seriousness of your pain by doing an examination.   Blood tests.   X-rays.   Ultrasound.   CT scan (computed tomography, special type of X-ray).   MRI (magnetic resonance imaging).   Cultures, for infection.   Barium enema (dye inserted in the large intestine, to better view it with  X-rays).   Colonoscopy (looking in intestine with a lighted tube).   Laparoscopy (minor surgery, looking in abdomen with a lighted tube).   Major abdominal exploratory surgery (looking in abdomen with a large incision).  TREATMENT   The treatment will depend on the cause of the pain.    Many cases can be observed and treated at home.   Over-the-counter medicines recommended by your caregiver.   Prescription medicine.   Antibiotics, for infection.   Birth control pills, for painful periods or for ovulation pain.   Hormone treatment, for endometriosis.   Nerve blocking injections.   Physical therapy.   Antidepressants.   Counseling with a psychologist or psychiatrist.   Minor or major surgery.  HOME CARE INSTRUCTIONS    Do not take laxatives, unless directed by your caregiver.   Take over-the-counter pain medicine only if ordered by your caregiver. Do not take aspirin because it can cause an upset stomach or bleeding.   Try a clear liquid diet (broth or water) as ordered by your caregiver. Slowly move to a bland diet, as tolerated, if the pain is related to the stomach or intestine.   Have a thermometer and take your temperature several times a day, and record it.   Bed rest and sleep, if it helps the pain.   Avoid sexual intercourse, if it causes pain.   Avoid stressful situations.   Keep your follow-up appointments and tests, as your caregiver orders.   If the pain does   not go away with medicine or surgery, you may try:   Acupuncture.   Relaxation exercises (yoga, meditation).   Group therapy.   Counseling.  SEEK MEDICAL CARE IF:    You notice certain foods cause stomach pain.   Your home care treatment is not helping your pain.   You need stronger pain medicine.   You want your IUD removed.   You feel faint or lightheaded.   You develop nausea and vomiting.   You develop a rash.   You are having side effects or an allergy to your medicine.  SEEK IMMEDIATE MEDICAL CARE IF:    Your  pain does not go away or gets worse.   You have a fever.   Your pain is felt only in portions of the abdomen. The right side could possibly be appendicitis. The left lower portion of the abdomen could be colitis or diverticulitis.   You are passing blood in your stools (bright red or black tarry stools, with or without vomiting).   You have blood in your urine.   You develop chills, with or without a fever.   You pass out.  MAKE SURE YOU:    Understand these instructions.   Will watch your condition.   Will get help right away if you are not doing well or get worse.  Document Released: 12/16/2006 Document Revised: 05/13/2011 Document Reviewed: 01/05/2009  [employer] Patient Information 2014 [employer]  Ovarian Cyst  An ovarian cyst is a fluid-filled sac that forms on an ovary. The ovaries are small organs that produce eggs in women. Various types of cysts can form on the ovaries. Most are not cancerous. Many do not cause problems, and they often go away on their own. Some may cause symptoms and require treatment. Common types of ovarian cysts include:   Functional cysts These cysts may occur every month during the menstrual cycle. This is normal. The cysts usually go away with the next menstrual cycle if the woman does not get pregnant. Usually, there are no symptoms with a functional cyst.   Endometrioma cysts These cysts form from the tissue that lines the uterus. They are also called "chocolate cysts" because they become filled with blood that turns brown. This type of cyst can cause pain in the lower abdomen during intercourse and with your menstrual period.   Cystadenoma cysts This type develops from the cells on the outside of the ovary. These cysts can get very big and cause lower abdomen pain and pain with intercourse. This type of cyst can twist on itself, cut off its blood supply, and cause severe pain. It can also easily rupture and cause a lot of pain.   Dermoid cysts This type of cyst  is sometimes found in both ovaries. These cysts may contain different kinds of body tissue, such as skin, teeth, hair, or cartilage. They usually do not cause symptoms unless they get very big.   Theca lutein cysts These cysts occur when too much of a certain hormone (human chorionic gonadotropin) is produced and overstimulates the ovaries to produce an egg. This is most common after procedures used to assist with the conception of a baby (in vitro fertilization).  CAUSES    Fertility drugs can cause a condition in which multiple large cysts are formed on the ovaries. This is called ovarian hyperstimulation syndrome.   A condition called polycystic ovary syndrome can cause hormonal imbalances that can lead to nonfunctional ovarian cysts.  SIGNS AND SYMPTOMS     Many ovarian cysts do not cause symptoms. If symptoms are present, they may include:   Pelvic pain or pressure.   Pain in the lower abdomen.   Pain during sexual intercourse.   Increasing girth (swelling) of the abdomen.   Abnormal menstrual periods.   Increasing pain with menstrual periods.   Stopping having menstrual periods without being pregnant.  DIAGNOSIS   These cysts are commonly found during a routine or annual pelvic exam. Tests may be ordered to find out more about the cyst. These tests may include:   Ultrasound.   X-ray of the pelvis.   CT scan.   MRI.   Blood tests.  TREATMENT   Many ovarian cysts go away on their own without treatment. Your health care provider may want to check your cyst regularly for 2 3 months to see if it changes. For women in menopause, it is particularly important to monitor a cyst closely because of the higher rate of ovarian cancer in menopausal women. When treatment is needed, it may include any of the following:   A procedure to drain the cyst (aspiration). This may be done using a long needle and ultrasound. It can also be done through a laparoscopic procedure. This involves using a thin, lighted tube  with a tiny camera on the end (laparoscope) inserted through a small incision.   Surgery to remove the whole cyst. This may be done using laparoscopic surgery or an open surgery involving a larger incision in the lower abdomen.   Hormone treatment or birth control pills. These methods are sometimes used to help dissolve a cyst.  HOME CARE INSTRUCTIONS    Only take over-the-counter or prescription medicines as directed by your health care provider.   Follow up with your health care provider as directed.   Get regular pelvic exams and Pap tests.  SEEK MEDICAL CARE IF:    Your periods are late, irregular, or painful, or they stop.   Your pelvic pain or abdominal pain does not go away.   Your abdomen becomes larger or swollen.   You have pressure on your bladder or trouble emptying your bladder completely.   You have pain during sexual intercourse.   You have feelings of fullness, pressure, or discomfort in your stomach.   You lose weight for no apparent reason.   You feel generally ill.   You become constipated.   You lose your appetite.   You develop acne.   You have an increase in body and facial hair.   You are gaining weight, without changing your exercise and eating habits.   You think you are pregnant.  SEEK IMMEDIATE MEDICAL CARE IF:    You have increasing abdominal pain.   You feel sick to your stomach (nauseous), and you throw up (vomit).   You develop a fever that comes on suddenly.   You have abdominal pain during a bowel movement.   Your menstrual periods become heavier than usual.  Document Released: 02/18/2005 Document Revised: 12/09/2012 Document Reviewed: 10/26/2012  [employer] Patient Information 2014 [employer]

## 2013-05-03 NOTE — ED Provider Notes (Signed)
RLQ abd pain, awaits abd/pelvis CT.    6:23 PM Abdominal and pelvis CT scan without evidence of acute appendicitis. There is mild enhancement of the urothelium of the right ureter without obstruction lesion identified. This may reflect a urinary tract infection. Patient's urine did have some evidence of bacteria to suggest infection however patient denies any dysuria. Patient continues to endorse abdominal pain which is worsening. Pain is located to right side of abdomen, reproducible on exam. No guarding rebound tenderness. Unable to rule out ovarian cyst, uterine fibroids, ovarian torsion or tubo-ovarian abscess. I offer a pelvic Ultrasounds for further evaluation.    8:50 PM Pelvic ultrasound demonstrate 2 prominent follicles to the right ovary but otherwise no other acute finding, specifically no evidence of ovarian torsion. Patient currently reports some improvement with abdominal pain but still endorsed pain. We'll give Toradol for pain.  9:10 PM Pt tolerates PO, would like to go home.  Recommend return precaution, pain medication and antinausea medication prescribed.    9:57 PM Prior to discharge, pt has her vital sign rechecked which shows a fever of 102.9, tachycardia.  I give pt tylenol and also recommend admission for obs, however pt declined.  Pt made aware to return if sxs worsen for serial abdominal exam.  Early appendicitis a possibility.     BP 107/54  Pulse 104  Temp(Src) 99 F (37.2 C) (Oral)  Resp 20  SpO2 99%  LMP 04/15/2013  I have reviewed nursing notes and vital signs. I personally reviewed the imaging tests through PACS system  I reviewed available ER/hospitalization records thought the EMR  Results for orders placed during the hospital encounter of 05/03/13  WET PREP, GENITAL      Result Value Ref Range   Yeast Wet Prep HPF POC NONE SEEN  NONE SEEN   Trich, Wet Prep NONE SEEN  NONE SEEN   Clue Cells Wet Prep HPF POC NONE SEEN  NONE SEEN   WBC, Wet Prep HPF  POC FEW (*) NONE SEEN  CBC WITH DIFFERENTIAL      Result Value Ref Range   WBC 10.3  4.0 - 10.5 K/uL   RBC 3.98  3.87 - 5.11 MIL/uL   Hemoglobin 12.3  12.0 - 15.0 g/dL   HCT 16.1  09.6 - 04.5 %   MCV 91.7  78.0 - 100.0 fL   MCH 30.9  26.0 - 34.0 pg   MCHC 33.7  30.0 - 36.0 g/dL   RDW 40.9  81.1 - 91.4 %   Platelets 200  150 - 400 K/uL   Neutrophils Relative % 85 (*) 43 - 77 %   Neutro Abs 8.7 (*) 1.7 - 7.7 K/uL   Lymphocytes Relative 7 (*) 12 - 46 %   Lymphs Abs 0.8  0.7 - 4.0 K/uL   Monocytes Relative 7  3 - 12 %   Monocytes Absolute 0.8  0.1 - 1.0 K/uL   Eosinophils Relative 0  0 - 5 %   Eosinophils Absolute 0.0  0.0 - 0.7 K/uL   Basophils Relative 0  0 - 1 %   Basophils Absolute 0.0  0.0 - 0.1 K/uL  COMPREHENSIVE METABOLIC PANEL      Result Value Ref Range   Sodium 137  137 - 147 mEq/L   Potassium 4.4  3.7 - 5.3 mEq/L   Chloride 96  96 - 112 mEq/L   CO2 28  19 - 32 mEq/L   Glucose, Bld 111 (*) 70 - 99 mg/dL  BUN 11  6 - 23 mg/dL   Creatinine, Ser 1.61  0.50 - 1.10 mg/dL   Calcium 9.8  8.4 - 09.6 mg/dL   Total Protein 7.3  6.0 - 8.3 g/dL   Albumin 4.1  3.5 - 5.2 g/dL   AST 21  0 - 37 U/L   ALT 13  0 - 35 U/L   Alkaline Phosphatase 82  39 - 117 U/L   Total Bilirubin 0.6  0.3 - 1.2 mg/dL   GFR calc non Af Amer >90  >90 mL/min   GFR calc Af Amer >90  >90 mL/min  LIPASE, BLOOD      Result Value Ref Range   Lipase 27  11 - 59 U/L  URINALYSIS, ROUTINE W REFLEX MICROSCOPIC      Result Value Ref Range   Color, Urine YELLOW  YELLOW   APPearance CLOUDY (*) CLEAR   Specific Gravity, Urine 1.011  1.005 - 1.030   pH 8.0  5.0 - 8.0   Glucose, UA NEGATIVE  NEGATIVE mg/dL   Hgb urine dipstick SMALL (*) NEGATIVE   Bilirubin Urine NEGATIVE  NEGATIVE   Ketones, ur NEGATIVE  NEGATIVE mg/dL   Protein, ur NEGATIVE  NEGATIVE mg/dL   Urobilinogen, UA 0.2  0.0 - 1.0 mg/dL   Nitrite NEGATIVE  NEGATIVE   Leukocytes, UA SMALL (*) NEGATIVE  URINE MICROSCOPIC-ADD ON      Result Value  Ref Range   Squamous Epithelial / LPF RARE  RARE   WBC, UA 7-10  <3 WBC/hpf   RBC / HPF 3-6  <3 RBC/hpf   Bacteria, UA RARE  RARE  POC URINE PREG, ED      Result Value Ref Range   Preg Test, Ur NEGATIVE  NEGATIVE   US Transvaginal Non-ob  05/03/2013   CLINICAL DATA:  Right lower quadrant abdominal/pelvic pain. Evaluate for torsion.  EXAM: TRANSABDOMINAL AND TRANSVAGINAL ULTRASOUND OF PELVIS  DOPPLER ULTRASOUND OF OVARIES  TECHNIQUE: Both transabdominal and transvaginal ultrasound examinations of the pelvis were performed. Transabdominal technique was performed for global imaging of the pelvis including uterus, ovaries, adnexal regions, and pelvic cul-de-sac.  It was necessary to proceed with endovaginal exam following the transabdominal exam to visualize the endometrium and ovaries to better advantage. Color and duplex Doppler ultrasound was utilized to evaluate blood flow to the ovaries.  COMPARISON:  Pelvic ultrasound 07/18/2009. Abdominal pelvic CT 05/03/2013.  FINDINGS: Uterus  Measurements: 6.8 x 3.8 x 4.7 cm. No fibroids or other mass visualized.  Endometrium  Thickness: 10 mm.  No focal abnormality visualized.  Right ovary  Measurements: 4.4 x 2.0 x 3.8 cm. As seen on CT, there are 2 dominant follicles within the right ovary, measuring 2.7 x 1.5 x 1.6 cm and 1.9 x 1.7 x 2.1 cm. There is no solid mass. Blood flow is present with color Doppler.  Left ovary  Measurements: 2.4 x 1.2 x 2.5 cm. Normal appearance/no adnexal mass.  Pulsed Doppler evaluation of both ovaries demonstrates normal low-resistance arterial and venous waveforms.  Other findings  A small amount of free pelvic fluid is within physiologic limits.  IMPRESSION: 1. No evidence of ovarian torsion. 2. Right ovarian follicles without suspicious characteristics. 3. The uterus and left ovary appear unremarkable.   Electronically Signed   By: Roxy Horseman M.D.   On: 05/03/2013 20:30   US Pelvis Complete  05/03/2013   CLINICAL DATA:  Right  lower quadrant abdominal/pelvic pain. Evaluate for torsion.  EXAM: TRANSABDOMINAL AND TRANSVAGINAL ULTRASOUND  OF PELVIS  DOPPLER ULTRASOUND OF OVARIES  TECHNIQUE: Both transabdominal and transvaginal ultrasound examinations of the pelvis were performed. Transabdominal technique was performed for global imaging of the pelvis including uterus, ovaries, adnexal regions, and pelvic cul-de-sac.  It was necessary to proceed with endovaginal exam following the transabdominal exam to visualize the endometrium and ovaries to better advantage. Color and duplex Doppler ultrasound was utilized to evaluate blood flow to the ovaries.  COMPARISON:  Pelvic ultrasound 07/18/2009. Abdominal pelvic CT 05/03/2013.  FINDINGS: Uterus  Measurements: 6.8 x 3.8 x 4.7 cm. No fibroids or other mass visualized.  Endometrium  Thickness: 10 mm.  No focal abnormality visualized.  Right ovary  Measurements: 4.4 x 2.0 x 3.8 cm. As seen on CT, there are 2 dominant follicles within the right ovary, measuring 2.7 x 1.5 x 1.6 cm and 1.9 x 1.7 x 2.1 cm. There is no solid mass. Blood flow is present with color Doppler.  Left ovary  Measurements: 2.4 x 1.2 x 2.5 cm. Normal appearance/no adnexal mass.  Pulsed Doppler evaluation of both ovaries demonstrates normal low-resistance arterial and venous waveforms.  Other findings  A small amount of free pelvic fluid is within physiologic limits.  IMPRESSION: 1. No evidence of ovarian torsion. 2. Right ovarian follicles without suspicious characteristics. 3. The uterus and left ovary appear unremarkable.   Electronically Signed   By: Roxy Horseman M.D.   On: 05/03/2013 20:30   Ct Abdomen Pelvis W Contrast  05/03/2013   CLINICAL DATA:  Lower pelvic pain which extends to the right lower quadrant 1 pain.  EXAM: CT ABDOMEN AND PELVIS WITH CONTRAST  TECHNIQUE: Multidetector CT imaging of the abdomen and pelvis was performed using the standard protocol following bolus administration of intravenous contrast.  CONTRAST:   80mL OMNIPAQUE IOHEXOL 300 MG/ML  SOLN  COMPARISON:  DG FLUORO GUIDE NDL PLC/BX dated 09/06/2009; CT ABD W/CM dated 09/03/2007  FINDINGS: Lung bases are clear.  No pericardial fluid.  No focal hepatic lesion. The gallbladder, pancreas, spleen, adrenal glands, and kidneys are normal. There is mild enhancement of the urothelium of the right ureter (image 30, series 2). The is no obstructing calculus evident.  Stomach, small bowel, appendix and cecum normal. There is a large volume stool throughout ascending transverse and descending colon. The rectosigmoid colon appears normal.  Abdominal or is normal caliber. No retroperitoneal periportal lymphadenopathy.  No fluid the pelvis. The uterus is normal. There are 2 dominant follicles within the right ovary measuring 1.7 cm each. The bladder appears normal. No pelvic lymphadenopathy. No aggressive osseous lesion.  IMPRESSION: 1. Normal appendix. 2. Mild enhancement of the urothelium of the right ureter without obstructing lesion identified. This could indicate urinary tract infection. Recommend clinical correlation. No CT evidence of pyelonephritis. 3. Moderate volume of stool throughout the colon suggests constipation   Electronically Signed   By: Genevive Bi M.D.   On: 05/03/2013 17:52   [place] Art/ven Flow Abd Pelv Doppler  05/03/2013   CLINICAL DATA:  Right lower quadrant abdominal/pelvic pain. Evaluate for torsion.  EXAM: TRANSABDOMINAL AND TRANSVAGINAL ULTRASOUND OF PELVIS  DOPPLER ULTRASOUND OF OVARIES  TECHNIQUE: Both transabdominal and transvaginal ultrasound examinations of the pelvis were performed. Transabdominal technique was performed for global imaging of the pelvis including uterus, ovaries, adnexal regions, and pelvic cul-de-sac.  It was necessary to proceed with endovaginal exam following the transabdominal exam to visualize the endometrium and ovaries to better advantage. Color and duplex Doppler ultrasound was utilized to evaluate blood flow  to the  ovaries.  COMPARISON:  Pelvic ultrasound 07/18/2009. Abdominal pelvic CT 05/03/2013.  FINDINGS: Uterus  Measurements: 6.8 x 3.8 x 4.7 cm. No fibroids or other mass visualized.  Endometrium  Thickness: 10 mm.  No focal abnormality visualized.  Right ovary  Measurements: 4.4 x 2.0 x 3.8 cm. As seen on CT, there are 2 dominant follicles within the right ovary, measuring 2.7 x 1.5 x 1.6 cm and 1.9 x 1.7 x 2.1 cm. There is no solid mass. Blood flow is present with color Doppler.  Left ovary  Measurements: 2.4 x 1.2 x 2.5 cm. Normal appearance/no adnexal mass.  Pulsed Doppler evaluation of both ovaries demonstrates normal low-resistance arterial and venous waveforms.  Other findings  A small amount of free pelvic fluid is within physiologic limits.  IMPRESSION: 1. No evidence of ovarian torsion. 2. Right ovarian follicles without suspicious characteristics. 3. The uterus and left ovary appear unremarkable.   Electronically Signed   By: Roxy HorsemanBill  Veazey M.D.   On: 05/03/2013 20:30      Fayrene HelperBowie Imanie Darrow, PA-C 05/03/13 2110  Fayrene HelperBowie Lerry Cordrey, PA-C 05/03/13 2159

## 2013-05-04 LAB — GC/CHLAMYDIA PROBE AMP
CT PROBE, AMP APTIMA: NEGATIVE
GC PROBE AMP APTIMA: NEGATIVE

## 2013-05-04 NOTE — ED Provider Notes (Signed)
Medical screening examination/treatment/procedure(s) were performed by non-physician practitioner and as supervising physician I was immediately available for consultation/collaboration.   EKG Interpretation None       Esly Selvage, MD 05/04/13 1458 

## 2013-05-05 ENCOUNTER — Telehealth: Payer: Self-pay | Admitting: *Deleted

## 2013-05-05 NOTE — Telephone Encounter (Signed)
Dr. Milinda Antisower sent me a message requesting that I call and check on pt since she left the ED  Called pt and no answer so left voicemail requesting pt to call office

## 2013-05-06 NOTE — ED Provider Notes (Signed)
I personally performed the services described in this documentation, which was scribed in my presence. The recorded information has been reviewed and is accurate.   Gilda Creasehristopher J. Kelena Garrow, MD 05/06/13 1106

## 2013-05-11 NOTE — Telephone Encounter (Signed)
Left 2nd voicemail requesting pt to call office  

## 2013-06-10 NOTE — Telephone Encounter (Signed)
Pt never responded to my calls/voicemails. Pt has not been seen here since 2011

## 2013-06-25 ENCOUNTER — Other Ambulatory Visit: Payer: Self-pay | Admitting: Family Medicine

## 2013-06-25 DIAGNOSIS — J029 Acute pharyngitis, unspecified: Secondary | ICD-10-CM

## 2013-07-01 ENCOUNTER — Ambulatory Visit
Admission: RE | Admit: 2013-07-01 | Discharge: 2013-07-01 | Disposition: A | Discharge status: Home / Self Care | Payer: No Typology Code available for payment source | Source: Ambulatory Visit | Attending: Family Medicine | Admitting: Family Medicine

## 2013-07-01 DIAGNOSIS — J029 Acute pharyngitis, unspecified: Secondary | ICD-10-CM

## 2013-12-17 ENCOUNTER — Other Ambulatory Visit: Payer: Self-pay

## 2014-05-05 ENCOUNTER — Encounter: Payer: Self-pay | Admitting: Neurology

## 2014-05-05 ENCOUNTER — Ambulatory Visit (INDEPENDENT_AMBULATORY_CARE_PROVIDER_SITE_OTHER): Payer: 59 | Admitting: Neurology

## 2014-05-05 VITALS — BP 112/70 | HR 78

## 2014-05-05 DIAGNOSIS — G47419 Narcolepsy without cataplexy: Secondary | ICD-10-CM

## 2014-05-05 DIAGNOSIS — M5431 Sciatica, right side: Secondary | ICD-10-CM | POA: Diagnosis not present

## 2014-05-05 DIAGNOSIS — F909 Attention-deficit hyperactivity disorder, unspecified type: Secondary | ICD-10-CM | POA: Diagnosis not present

## 2014-05-05 DIAGNOSIS — F988 Other specified behavioral and emotional disorders with onset usually occurring in childhood and adolescence: Secondary | ICD-10-CM

## 2014-05-05 DIAGNOSIS — M5432 Sciatica, left side: Secondary | ICD-10-CM

## 2014-05-05 DIAGNOSIS — G40B09 Juvenile myoclonic epilepsy, not intractable, without status epilepticus: Secondary | ICD-10-CM | POA: Diagnosis not present

## 2014-05-05 MED ORDER — PROTRIPTYLINE HCL 10 MG PO TABS: 10.0000 mg | ORAL_TABLET | Freq: Every day | ORAL | Status: DC

## 2014-05-05 MED ORDER — AMPHETAMINE-DEXTROAMPHETAMINE 30 MG PO TABS: 30.0000 mg | ORAL_TABLET | Freq: Three times a day (TID) | ORAL | Status: DC

## 2014-05-05 NOTE — Progress Notes (Signed)
[facility]  PATIENT: Abigail Lowery DOB: [birth year]  REFERRING DOCTOR OR PCP:  Adaku Nnodi SOURCE: patient  _________________________________   HISTORICAL  CHIEF COMPLAINT:  Chief Complaint  Patient presents with  . Seizures    She denies any recent sz. activity.  Sts she continues Lamictal  bid.  . narcolepsy    Sts. narcolepsy is well-controlled with Adderall  tid.  . Back Pain    Sts. 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  Narcolepsy/ADD:    She was diagnosed with narcolepsy in 2008 after presenting with severe sleepiness. The PSG showed mild OSA despite her being very thin and the MSLT showed severe hypersomnia with a sleep latency below 2 minutes and sleep onset REM episodes. She was placed on stimulants and is currently on Adderall 30 mg 3 times a day. She tolerates this dose well. She feels her hypersomnia does well once she is awake but she still has difficulty waking up some mornings. She gets very rare episodes of cataplexy probably just once every month or 2 now.   He has sleep paralysis about once a week but it is not troubling to her. She will have hypnagogic hallucinations at times.  JME:    She was diagnosed with JME around age 47. 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 Similar staring spells occurred and she had an EEG with a pattern consistent with juvenile myoclonic epilepsy. In the mornings, she would often have mild clonus. She still has mild clonus at times in the morning. At age 66, she had a single convulsive seizure. Initially, she was placed on Depakote but due to some breakthrough staring spells and poor tolerability, she was switched to Lamictal. She continues on Lamictal  and tolerates it well. She has not had any generalized tonic-clonic seizure while on Lamictal. She does not appear to be having staring spells. She still gets some morning myoclonus.  Currently, she is on 200 mg twice a day.  Back pain:   She gets low back pain with left more than right sciatic-type pain. She has received the most benefit from piriformis trigger point injections. MRI of the lumbar spine a few years ago was normal.  Headache: She gets migraine headaches with throbbing pain a couple times a week. Usually she takes Motrin, and if able to will lay down for a nap.  If she sleeps she feels much better when she wakes up. She notes some photophobia and phonophobia with the headaches. Moving will make the headaches worse. She occasionally has some nausea.    Data:   She had PSG and MSLT in 2008 showing severe hypersomnia and sleep onset REM consistent with narcolepsy. She also had mild OSA on that initial sleep study.   A repeat PSG 06/28/2012 showed no significant OSA (AHI equals 0.2). She did have some snoring. She also had moderate periodic limb movements of sleep with an index of 25.3 and an arousal index of 11.9.     REVIEW OF SYSTEMS: Constitutional: No fevers, chills, sweats, or change in appetite Eyes: No visual changes, double vision, eye pain Ear, nose and throat: No hearing loss, ear pain, nasal congestion, sore throat Cardiovascular: No chest pain, palpitations Respiratory: No shortness of  breath at rest or with exertion.   No wheezes GastrointestinaI: No nausea, vomiting, diarrhea, abdominal pain, fecal incontinence Genitourinary: No dysuria, urinary retention or frequency.  No nocturia. Musculoskeletal: No neck pain, back pain Integumentary: No rash, pruritus, skin lesions Neurological: as above Psychiatric: No depression at this time.  No anxiety Endocrine: No palpitations, diaphoresis, change in appetite, change in weigh or increased thirst Hematologic/Lymphatic:  No anemia, purpura, petechiae. Allergic/Immunologic: No itchy/runny eyes, nasal congestion, recent allergic reactions, rashes  ALLERGIES: Allergies  Allergen Reactions  . Dust Mite Extract Itching  . Mold Extract [Trichophyton] Itching    HOME MEDICATIONS:  Current outpatient prescriptions:  .  acetaminophen (TYLENOL) 500 MG tablet, Take 500 mg by mouth once., Disp: , Rfl:  .  amphetamine-dextroamphetamine (ADDERALL) 30 MG tablet, Take 30 mg by mouth 3 (three) times daily., Disp: , Rfl:  .  FLUoxetine (PROZAC) 40 MG capsule, Take 40 mg by mouth daily., Disp: , Rfl:  .  ibuprofen (ADVIL,MOTRIN) 400 MG tablet, Take 1 tablet (400 mg total) by mouth every 6 (six) hours as needed., Disp: 30 tablet, Rfl: 0 .  lamoTRIgine (LAMICTAL) 200 MG tablet, Take 200 mg by mouth 2 (two) times daily., Disp: , Rfl:  .  LORazepam (ATIVAN) 0.5 MG tablet, Take 0.5 mg by mouth every 8 (eight) hours as needed for anxiety., Disp: , Rfl:  .  amphetamine-dextroamphetamine (ADDERALL) 20 MG tablet, Take 20-40 mg by mouth 3 (three) times daily. Take 40 mg every morning, 20 mg every day at noon, and 20 mg (if needed) early evening for narcolepsy., Disp: , Rfl:  .  ciprofloxacin (CIPRO) 500 MG tablet, Take 1 tablet (500 mg total) by mouth 2 (two) times daily. (Patient not taking: Reported on 05/05/2014), Disp: 14 tablet, Rfl: 0 .  HYDROcodone-acetaminophen (NORCO/VICODIN) 5-325 MG per tablet, Take 1 tablet by mouth every 6 (six) hours as needed for severe pain. (Patient not taking: Reported on 05/05/2014), Disp: 10 tablet, Rfl: 0 .  promethazine (PHENERGAN) 25 MG tablet, Take 1 tablet (25 mg total) by mouth every 6 (six) hours as needed for nausea. (Patient not taking: Reported on 05/05/2014), Disp: 20 tablet, Rfl: 0  PAST MEDICAL HISTORY: Past Medical History  Diagnosis Date  . Seizures     Petit mal  . Anxiety   . Depression   . Headache(784.0)   . Colitis 2003  . Narcolepsy   . Epilepsy   . Migraines   .  Hyperlipidemia   . Narcolepsy     PAST SURGICAL HISTORY: Past Surgical History  Procedure Laterality Date  . Adenoidectomy  1998  . Wrist surgery Right 2004  . Bilateral hip arthroscopy  2011, 2012    FAMILY HISTORY: Family History  Problem Relation Age of Onset  . Depression Mother   . Anxiety disorder Mother   . Depression Father   . Anxiety disorder Maternal Aunt   . Depression Maternal Aunt   . Bipolar disorder Maternal Uncle   . Schizophrenia Maternal Grandfather   . Bipolar disorder Cousin     SOCIAL HISTORY:  History   Social History  . Marital Status: Single    Spouse Name: N/A  . Number of Children: N/A  . Years of Education: N/A   Occupational History  . Not on file.   Social History Main Topics  . Smoking status: Never Smoker   . Smokeless tobacco: Never Used  . Alcohol Use: 0.6 oz/week    1 Glasses of wine per week  Comment: Rare  . Drug Use: No  . Sexual Activity: No   Other Topics Concern  . Not on file   Social History Narrative     PHYSICAL EXAM  Filed Vitals:   05/05/14 1123  BP: 112/70  Pulse: 78    General: The patient is well-developed and well-nourished and in no acute distress  Neck: The neck is supple, no carotid bruits are noted.  The neck is nontender.  Cardiovascular: The heart has a regular rate and rhythm with a normal S1 and S2. There were no murmurs, gallops or rubs. Lungs are clear to auscultation.  Skin: Extremities are without significant edema.  Musculoskeletal:   She is tender over both piriformis muscles, worse on the left. There is not much lumbar paraspinal tenderness. Range of motion of the back appeared normal.  Neurologic Exam  Mental status: The patient is alert and oriented x 3 at the time of the examination. The patient has apparent normal recent and remote memory, with an apparently normal attention span and concentration ability.   Speech is normal.  Cranial nerves: Extraocular movements are  full.  Facial symmetry is present. There is good facial sensation to soft touch bilaterally.Facial strength is normal.  Trapezius and sternocleidomastoid strength is normal. No dysarthria is noted.  The tongue is midline, and the patient has symmetric elevation of the soft palate. No obvious hearing deficits are noted.  Motor:  Muscle bulk is normal.   Tone is normal. Strength is  5 / 5 in all 4 extremities.   Sensory: Sensory testing is intact to pinprick, soft touch and vibration sensation in all 4 extremities.  Coordination: Cerebellar testing reveals good finger-nose-finger and heel-to-shin bilaterally.  Gait and station: Station is normal.   Gait is normal. Tandem gait is normal. Romberg is negative.   Reflexes: Deep tendon reflexes are symmetric and normal bilaterally.       DIAGNOSTIC DATA (LABS, IMAGING, TESTING) - I reviewed patient records, labs, notes, testing and imaging myself where available.  Lab Results  Component Value Date   WBC 10.3 05/03/2013   HGB 12.3 05/03/2013   HCT 36.5 05/03/2013   MCV 91.7 05/03/2013   PLT 200 05/03/2013      Component Value Date/Time   NA 137 05/03/2013 1358   K 4.4 05/03/2013 1358   CL 96 05/03/2013 1358   CO2 28 05/03/2013 1358   GLUCOSE 111* 05/03/2013 1358   BUN 11 05/03/2013 1358   CREATININE 0.69 05/03/2013 1358   CALCIUM 9.8 05/03/2013 1358   PROT 7.3 05/03/2013 1358   ALBUMIN 4.1 05/03/2013 1358   AST 21 05/03/2013 1358   ALT 13 05/03/2013 1358   ALKPHOS 82 05/03/2013 1358   BILITOT 0.6 05/03/2013 1358   GFRNONAA >90 05/03/2013 1358   GFRAA >90 05/03/2013 1358       ASSESSMENT AND PLAN  NARCOLEPSY W/O CATAPLEXY  Attention deficit disorder  Juvenile myoclonic epilepsy, not intractable, without status epilepticus  Bilateral sciatica   1.    Renew medications for narcolepsy, trial of protriptyline to see if it helps cataplexy and sleep paralysis 2.   Continue lamotrigine for JME 3.   Trigger point inject  bilateral piriformis muscles with 80 mg Depo-Medrol in 5 mL Marcaine. She tolerated the procedure well and there were no complications. Return to clinic in 5 months or sooner if she has new or worsening neurologic symptoms.   Richard A. Epimenio Foot, MD, PhD 05/05/2014, 10:53 AM Certified in Neurology, Clinical Neurophysiology, Sleep  Medicine, Pain Medicine and Neuroimaging  [facility] [address] [place], [place] [address] [phone]

## 2014-06-23 ENCOUNTER — Telehealth: Payer: Self-pay

## 2014-06-23 NOTE — Telephone Encounter (Signed)
[facility]([employer]) has approved the request for continuation of coverage on Adderall effective until 06/23/2015 Ref # [id number]

## 2014-07-26 ENCOUNTER — Telehealth: Payer: Self-pay | Admitting: Neurology

## 2014-07-26 MED ORDER — AMPHETAMINE-DEXTROAMPHETAMINE 30 MG PO TABS: 30.0000 mg | ORAL_TABLET | Freq: Three times a day (TID) | ORAL | Status: DC

## 2014-07-26 NOTE — Telephone Encounter (Signed)
Rx. printed, signed, up front [facility]/fim 

## 2014-07-26 NOTE — Telephone Encounter (Signed)
Pt called and requested a refill Rx. amphetamine-dextroamphetamine (ADDERALL) 30 MG tablet. Informed patient it would be ready for pick up within 24 hours unless advised otherwise by the nurse.

## 2014-08-30 ENCOUNTER — Other Ambulatory Visit: Payer: Self-pay | Admitting: Neurology

## 2014-08-30 NOTE — Telephone Encounter (Signed)
Patient called requesting refill foramphetamine-dextroamphetamine (ADDERALL) 30 MG tablet . Patient advised RX will be ready within 24 hours unless informed otherwise by RN. Patient can be reached at [phone].

## 2014-08-31 ENCOUNTER — Other Ambulatory Visit: Payer: Self-pay | Admitting: Neurology

## 2014-08-31 MED ORDER — AMPHETAMINE-DEXTROAMPHETAMINE 30 MG PO TABS: 30.0000 mg | ORAL_TABLET | Freq: Three times a day (TID) | ORAL | Status: DC

## 2014-08-31 NOTE — Telephone Encounter (Signed)
Patient is needing a refill on her amphetamine-dextroamphetamine (ADDERALL) 30 MG tablet the best number to contact her is [phone]

## 2014-08-31 NOTE — Telephone Encounter (Signed)
Request entered, forwarded to provider for approval.  

## 2014-09-01 ENCOUNTER — Encounter: Payer: Self-pay | Admitting: *Deleted

## 2014-09-01 NOTE — Progress Notes (Signed)
adderall rx. up front [facility]/fim

## 2014-10-05 ENCOUNTER — Encounter: Payer: Self-pay | Admitting: Neurology

## 2014-10-05 ENCOUNTER — Ambulatory Visit (INDEPENDENT_AMBULATORY_CARE_PROVIDER_SITE_OTHER): Payer: 59 | Admitting: Neurology

## 2014-10-05 VITALS — BP 118/80 | HR 83 | Ht <= 58 in | Wt 113.4 lb

## 2014-10-05 DIAGNOSIS — M5432 Sciatica, left side: Secondary | ICD-10-CM | POA: Diagnosis not present

## 2014-10-05 DIAGNOSIS — F909 Attention-deficit hyperactivity disorder, unspecified type: Secondary | ICD-10-CM | POA: Diagnosis not present

## 2014-10-05 DIAGNOSIS — G40B09 Juvenile myoclonic epilepsy, not intractable, without status epilepticus: Secondary | ICD-10-CM

## 2014-10-05 DIAGNOSIS — F988 Other specified behavioral and emotional disorders with onset usually occurring in childhood and adolescence: Secondary | ICD-10-CM

## 2014-10-05 DIAGNOSIS — G47419 Narcolepsy without cataplexy: Secondary | ICD-10-CM | POA: Diagnosis not present

## 2014-10-05 DIAGNOSIS — M5431 Sciatica, right side: Secondary | ICD-10-CM

## 2014-10-05 MED ORDER — AMPHETAMINE-DEXTROAMPHETAMINE 30 MG PO TABS: 30.0000 mg | ORAL_TABLET | Freq: Three times a day (TID) | ORAL | Status: DC

## 2014-10-05 NOTE — Patient Instructions (Signed)
If you note worsening of the numbness in the left arm please let us know  Return in about 4 months.

## 2014-10-05 NOTE — Progress Notes (Signed)
[facility]  PATIENT: Abigail Lowery DOB: [birth year]  REFERRING DOCTOR OR PCP:  Adaku Nnodi SOURCE: patient  _________________________________   HISTORICAL  CHIEF COMPLAINT:  Chief Complaint  Patient presents with  . Follow-up    Room 5 by herself. Adderall is working well but she needs a refill. She forgot to take Protriptyline (cataplexy/sleep paralysis) for a couple months but is now taking w/ no complaints. No new seizures since last OV.    HISTORY OF PRESENT ILLNESS:  Abigail Lowery is a 29 year old woman with juvenile myoclonic epilepsy and narcolepsy I also have seen her for headaches and lower back pain/sciatica in the past.  Narcolepsy/ADD:   She starrted protriptyline after the last visit and she feels her cataplexy and sleep paralysis are better.      Narcolepsy History:  She was diagnosed with narcolepsy in 2008 after presenting with severe sleepiness. She had PSG and MSLT in 2008 showing severe hypersomnia and sleep onset REM consistent with narcolepsy. She also had mild OSA on that initial sleep study.   A repeat PSG 06/28/2012 showed no significant OSA (AHI equals 0.2). She did have some snoring. She also had moderate periodic limb movements of sleep with an index of 25.3 and an arousal index of 11.9.. She was placed on stimulants and is currently on Adderall 30 mg 3 times a day. She tolerates this dose well. She feels her hypersomnia does well once she is awake.   She sets 3 alarms because she has trouble getting up many mornings. She gets very rare episodes of cataplexy once every month or 2 now.   She was having sleep paralysis about once a week but it is about 1/2 as much with protriptyline. She will have hypnagogic hallucinations at times.   JME:    She is doing well on lamotrigine.   She has had some staring spells when she misses a dose or two of lamotrigine and is doing better with compliance.    She feels the morning myoclonus is much better  with lamotrigine.  No GTC x 8 years.    Seizure history:    She was diagnosed with JME around age 1. 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 Similar staring spells occurred and she had an EEG with a pattern consistent with juvenile myoclonic epilepsy. In the mornings, she would often have mild clonus. She still has mild clonus at times in the morning. At age 52, she had a single convulsive seizure. Initially, she was placed on Depakote but due to some breakthrough staring spells and poor tolerability, she was switched to Lamictal.    Back pain:   She gets low back pain with bilateral sciatic-type pain. She has received the most benefit from piriformis trigger point injections, she gets about 3 shots/year. MRI of the lumbar spine a few years ago was normal.         Headache: She gets migraine headaches with throbbing pain lasting 2-3 times twice a month.  She notes some photophobia and phonophobia with the headaches. Moving will make the headaches worse. She occasionally has some nausea.  One of her 2 headaches will be perimenstrual.    Usually she takes Motrin, and if able to will lay down for a nap.  If she sleeps she feels much better when she wakes up.   She has not tried sumatriptan.      Data:       REVIEW OF SYSTEMS: Constitutional: No fevers,  chills, sweats, or change in appetite Eyes: No visual changes, double vision, eye pain Ear, nose and throat: No hearing loss, ear pain, nasal congestion, sore throat Cardiovascular: No chest pain, palpitations Respiratory: No shortness of breath at rest or with exertion.   No wheezes GastrointestinaI: No nausea, vomiting, diarrhea, abdominal pain, fecal incontinence Genitourinary: No dysuria, urinary retention or frequency.  No nocturia. Musculoskeletal: No neck pain, back pain Integumentary: No rash, pruritus, skin lesions Neurological: as above Psychiatric: No depression at this time.  No anxiety Endocrine: No  palpitations, diaphoresis, change in appetite, change in weigh or increased thirst Hematologic/Lymphatic: No anemia, purpura, petechiae. Allergic/Immunologic: No itchy/runny eyes, nasal congestion, recent allergic reactions, rashes  ALLERGIES: Allergies  Allergen Reactions  . Dust Mite Extract Itching  . Mold Extract [Trichophyton] Itching    HOME MEDICATIONS:  Current outpatient prescriptions:  .  acetaminophen (TYLENOL) 500 MG tablet, Take 500 mg by mouth once., Disp: , Rfl:  .  amphetamine-dextroamphetamine (ADDERALL) 30 MG tablet, Take 1 tablet by mouth 3 (three) times daily., Disp: 90 tablet, Rfl: 0 .  amphetamine-dextroamphetamine (ADDERALL) 30 MG tablet, Take 1 tablet by mouth 3 (three) times daily., Disp: 90 tablet, Rfl: 0 .  ciprofloxacin (CIPRO) 500 MG tablet, Take 1 tablet (500 mg total) by mouth 2 (two) times daily. (Patient not taking: Reported on 05/05/2014), Disp: 14 tablet, Rfl: 0 .  FLUoxetine (PROZAC) 40 MG capsule, Take 40 mg by mouth daily., Disp: , Rfl:  .  HYDROcodone-acetaminophen (NORCO/VICODIN) 5-325 MG per tablet, Take 1 tablet by mouth every 6 (six) hours as needed for severe pain. (Patient not taking: Reported on 05/05/2014), Disp: 10 tablet, Rfl: 0 .  ibuprofen (ADVIL,MOTRIN) 400 MG tablet, Take 1 tablet (400 mg total) by mouth every 6 (six) hours as needed., Disp: 30 tablet, Rfl: 0 .  lamoTRIgine (LAMICTAL) 200 MG tablet, Take 200 mg by mouth 2 (two) times daily., Disp: , Rfl:  .  LORazepam (ATIVAN) 0.5 MG tablet, Take 0.5 mg by mouth every 8 (eight) hours as needed for anxiety., Disp: , Rfl:  .  promethazine (PHENERGAN) 25 MG tablet, Take 1 tablet (25 mg total) by mouth every 6 (six) hours as needed for nausea. (Patient not taking: Reported on 05/05/2014), Disp: 20 tablet, Rfl: 0 .  protriptyline (VIVACTIL) 10 MG tablet, Take 1 tablet (10 mg total) by mouth at bedtime., Disp: 30 tablet, Rfl: 11  PAST MEDICAL HISTORY: Past Medical History  Diagnosis Date  .  Seizures     Petit mal  . Anxiety   . Depression   . Headache(784.0)   . Colitis 2003  . Narcolepsy   . Epilepsy   . Migraines   . Hyperlipidemia   . Narcolepsy     PAST SURGICAL HISTORY: Past Surgical History  Procedure Laterality Date  . Adenoidectomy  1998  . Wrist surgery Right 2004  . Bilateral hip arthroscopy  2011, 2012    FAMILY HISTORY: Family History  Problem Relation Age of Onset  . Depression Mother   . Anxiety disorder Mother   . Depression Father   . Anxiety disorder Maternal Aunt   . Depression Maternal Aunt   . Bipolar disorder Maternal Uncle   . Schizophrenia Maternal Grandfather   . Bipolar disorder Cousin     SOCIAL HISTORY:  History   Social History  . Marital Status: Single    Spouse Name: N/A  . Number of Children: N/A  . Years of Education: N/A   Occupational History  .  Not on file.   Social History Main Topics  . Smoking status: Never Smoker   . Smokeless tobacco: Never Used  . Alcohol Use: 0.6 oz/week    1 Glasses of wine per week     Comment: Rare  . Drug Use: No  . Sexual Activity: No   Other Topics Concern  . Not on file   Social History Narrative     PHYSICAL EXAM  Filed Vitals:   10/05/14 1043  BP: 118/80  Pulse: 83    General: The patient is well-developed and well-nourished and in no acute distress   Musculoskeletal:   She is tender over both piriformis muscles, worse on the left. There is not much lumbar paraspinal tenderness. Range of motion of the back appeared normal.  Neurologic Exam  Mental status: The patient is alert and oriented x 3 at the time of the examination. The patient has apparent normal recent and remote memory, with an apparently normal attention span and concentration ability.   Speech is normal.  Cranial nerves: Extraocular movements are full.  Facial symmetry is present. There is good facial sensation to soft touch bilaterally.Facial strength is normal.  Trapezius and  sternocleidomastoid strength is normal. No dysarthria is noted.    No obvious hearing deficits are noted.  Motor:  Muscle bulk is normal.   Tone is normal. Strength is  5 / 5 in all 4 extremities.   Sensory: Sensory testing is intact to pinprick, soft touch and vibration sensation in all 4 extremities.  Coordination: Cerebellar testing reveals good finger-nose-finger and heel-to-shin bilaterally.  Gait and station: Station is normal.   Gait is normal. Tandem gait is normal.    Reflexes: Deep tendon reflexes are symmetric and normal bilaterally.       DIAGNOSTIC DATA (LABS, IMAGING, TESTING) - I reviewed patient records, labs, notes, testing and imaging myself where available.  Lab Results  Component Value Date   WBC 10.3 05/03/2013   HGB 12.3 05/03/2013   HCT 36.5 05/03/2013   MCV 91.7 05/03/2013   PLT 200 05/03/2013      Component Value Date/Time   NA 137 05/03/2013 1358   K 4.4 05/03/2013 1358   CL 96 05/03/2013 1358   CO2 28 05/03/2013 1358   GLUCOSE 111* 05/03/2013 1358   BUN 11 05/03/2013 1358   CREATININE 0.69 05/03/2013 1358   CALCIUM 9.8 05/03/2013 1358   PROT 7.3 05/03/2013 1358   ALBUMIN 4.1 05/03/2013 1358   AST 21 05/03/2013 1358   ALT 13 05/03/2013 1358   ALKPHOS 82 05/03/2013 1358   BILITOT 0.6 05/03/2013 1358   GFRNONAA >90 05/03/2013 1358   GFRAA >90 05/03/2013 1358       ASSESSMENT AND PLAN  Juvenile myoclonic epilepsy, not intractable, without status epilepticus  Bilateral sciatica  Attention deficit disorder  NARCOLEPSY W/O CATAPLEXY   1.    Renew stimulants for narcolepsy.   Continue protriptyline 2.   Continue lamotrigine for JME 3.   Trigger point inject bilateral piriformis muscles with 80 mg Depo-Medrol in 5 mL Marcaine. She tolerated the procedure well and there were no complications. Return to clinic in 5 months or sooner if she has new or worsening neurologic symptoms.   Richard A. Epimenio Foot, MD, PhD 10/05/2014, 10:51 AM Certified  in Neurology, Clinical Neurophysiology, Sleep Medicine, Pain Medicine and Neuroimaging  [facility] [address] [place], [place] [address] [phone]

## 2014-10-12 ENCOUNTER — Telehealth: Payer: Self-pay | Admitting: Neurology

## 2014-10-12 ENCOUNTER — Other Ambulatory Visit: Payer: Self-pay | Admitting: Neurology

## 2014-10-12 MED ORDER — SUMATRIPTAN SUCCINATE 100 MG PO TABS: 100.0000 mg | ORAL_TABLET | Freq: Once | ORAL | Status: DC | PRN

## 2014-10-12 NOTE — Telephone Encounter (Signed)
I called the patient back.  Says she would like to try a medication for migraines.  Please advise.  Thank you.

## 2014-10-12 NOTE — Telephone Encounter (Signed)
The patient is calling because she was seen in our office last week and thought Imitrex was to be called to [facility]. Please call patient and advise. Thank you.

## 2014-10-12 NOTE — Telephone Encounter (Signed)
I called Imitrex into [place]  [facility]

## 2014-10-12 NOTE — Telephone Encounter (Signed)
I lmom to let Laren know [facility] called rx. for Imitrex to [facility].  Instructions will be on packaging; if she has any questions she should call me/fim

## 2014-11-22 ENCOUNTER — Telehealth: Payer: Self-pay | Admitting: Neurology

## 2014-11-22 MED ORDER — AMPHETAMINE-DEXTROAMPHETAMINE 30 MG PO TABS: 30.0000 mg | ORAL_TABLET | Freq: Three times a day (TID) | ORAL | Status: DC

## 2014-11-22 NOTE — Telephone Encounter (Signed)
Rx's up front [facility]/fim 

## 2014-11-22 NOTE — Telephone Encounter (Signed)
Adderall rx. plus one postdated rx. on [facility] ledge to be signed/fim

## 2014-11-22 NOTE — Telephone Encounter (Signed)
Patient called to request refill on amphetamine-dextroamphetamine (ADDERALL) 30 MG tablet

## 2014-12-14 ENCOUNTER — Other Ambulatory Visit: Payer: Self-pay | Admitting: Neurology

## 2015-02-08 ENCOUNTER — Ambulatory Visit (INDEPENDENT_AMBULATORY_CARE_PROVIDER_SITE_OTHER): Payer: 59 | Admitting: Neurology

## 2015-02-08 ENCOUNTER — Encounter: Payer: Self-pay | Admitting: Neurology

## 2015-02-08 VITALS — BP 106/66 | HR 66 | Ht <= 58 in | Wt 105.8 lb

## 2015-02-08 DIAGNOSIS — M5431 Sciatica, right side: Secondary | ICD-10-CM | POA: Diagnosis not present

## 2015-02-08 DIAGNOSIS — G43009 Migraine without aura, not intractable, without status migrainosus: Secondary | ICD-10-CM | POA: Diagnosis not present

## 2015-02-08 DIAGNOSIS — F909 Attention-deficit hyperactivity disorder, unspecified type: Secondary | ICD-10-CM

## 2015-02-08 DIAGNOSIS — M5432 Sciatica, left side: Secondary | ICD-10-CM

## 2015-02-08 DIAGNOSIS — G40B09 Juvenile myoclonic epilepsy, not intractable, without status epilepticus: Secondary | ICD-10-CM | POA: Diagnosis not present

## 2015-02-08 DIAGNOSIS — F988 Other specified behavioral and emotional disorders with onset usually occurring in childhood and adolescence: Secondary | ICD-10-CM

## 2015-02-08 MED ORDER — AMPHETAMINE-DEXTROAMPHETAMINE 30 MG PO TABS: 30.0000 mg | ORAL_TABLET | Freq: Three times a day (TID) | ORAL | Status: DC

## 2015-02-08 MED ORDER — PROTRIPTYLINE HCL 10 MG PO TABS: 10.0000 mg | ORAL_TABLET | Freq: Every day | ORAL | Status: DC

## 2015-02-08 NOTE — Progress Notes (Signed)
[facility]  PATIENT: Abigail Lowery DOB: 1985-10-10  REFERRING DOCTOR OR PCP:  Adaku Nnodi SOURCE: patient  _________________________________   HISTORICAL  CHIEF COMPLAINT:  Chief Complaint  Patient presents with  . Follow-up    Rm 15. F/u on epilepsy/narcolepsy/ADD. Taking medications as prescribed. No SE. No new complaints.    HISTORY OF PRESENT ILLNESS:  Abigail Lowery is a 29 year old woman with juvenile myoclonic epilepsy and narcolepsy, migraine headaches and lower back pain/sciatica.  JME:    She is doing well on lamotrigine.     She has not had any staring spells since last visit.   No GTC x 8 - 9 years.      She has morning myoclonus many days and also may have some jerks at night, though this is not worsening.    Seizure history:    She was diagnosed with JME around age 55. 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 Similar staring spells occurred and she had an EEG with a pattern consistent with juvenile myoclonic epilepsy. In the mornings, she would often have mild clonus. She still has mild clonus at times in the morning. At age 79, she had a single convulsive seizure. Initially, she was placed on Depakote but due to some breakthrough staring spells and poor tolerability, she was switched to Lamictal.   Narcolepsy/ADD:   Her sleepiness and attention are helped a lot by the stimulants.     Protriptyline has helped the cataplexy and sleep paralysis are better.      Narcolepsy History:  She was diagnosed with narcolepsy in 2008 after presenting with severe sleepiness. She had PSG and MSLT in 2008 showing severe hypersomnia and sleep onset REM consistent with narcolepsy. She also had mild OSA on that initial sleep study.   A repeat PSG 06/28/2012 showed no significant OSA (AHI equals 0.2). She did have some snoring. She also had moderate periodic limb movements of sleep with an index of 25.3 and an arousal index of 11.9.. She was placed on  stimulants and is currently on Adderall 30 mg 3 times a day. She tolerates this dose well. She feels her hypersomnia does well once she is awake.   She sets 3 alarms because she has trouble getting up many mornings. She gets very rare episodes of cataplexy once every month or 2 now.   She was having sleep paralysis about once a week but it is about 1/2 as much with protriptyline. She will have hypnagogic hallucinations at times.   Back pain:   She is noting more low back pain and bilateral buttock and leg pain.   Pain is similar to that pain that she experienced in the past. Pain is worse with prolonged sitting.    She receives benefit from piriformis trigger point injections, she gets about 3 shots/year with at least 3 months benefit each time.    MRI of the lumbar spine a few years ago was normal.         Migraine Headache: She gets 3-5 migraine headaches a month.  She usually has about 1/2 of them associated with her period.   With the headache, she will have photophobia and phonophobia and nausea (no vomiting) with the headaches. Moving will make the headaches worse. Usually she takes Motrin first and will take sumatriptan if not better after 30-40 minutes.  Sumatriptan has helped a lot.  [name]      REVIEW OF SYSTEMS: Constitutional: No fevers, chills, sweats, or  change in appetite Eyes: No visual changes, double vision, eye pain Ear, nose and throat: No hearing loss, ear pain, nasal congestion, sore throat Cardiovascular: No chest pain, palpitations Respiratory: No shortness of breath at rest or with exertion.   No wheezes GastrointestinaI: No nausea, vomiting, diarrhea, abdominal pain, fecal incontinence Genitourinary: No dysuria, urinary retention or frequency.  No nocturia. Musculoskeletal: No neck pain, back pain Integumentary: No rash, pruritus, skin lesions Neurological: as above Psychiatric: No depression at this time.  No anxiety Endocrine: No palpitations, diaphoresis, change in  appetite, change in weigh or increased thirst Hematologic/Lymphatic: No anemia, purpura, petechiae. Allergic/Immunologic: No itchy/runny eyes, nasal congestion, recent allergic reactions, rashes  ALLERGIES: Allergies  Allergen Reactions  . Dust Mite Extract Itching  . Mold Extract [Trichophyton] Itching    HOME MEDICATIONS:  Current outpatient prescriptions:  .  amphetamine-dextroamphetamine (ADDERALL) 30 MG tablet, Take 1 tablet by mouth 3 (three) times daily., Disp: 90 tablet, Rfl: 0 .  FLUoxetine (PROZAC) 40 MG capsule, Take 40 mg by mouth daily., Disp: , Rfl:  .  lamoTRIgine (LAMICTAL) 200 MG tablet, TAKE 1 TABLET BY MOUTH TWICE A DAY., Disp: 180 tablet, Rfl: 1 .  LORazepam (ATIVAN) 0.5 MG tablet, Take 0.5 mg by mouth every 8 (eight) hours as needed for anxiety., Disp: , Rfl:  .  promethazine (PHENERGAN) 25 MG tablet, Take 1 tablet (25 mg total) by mouth every 6 (six) hours as needed for nausea., Disp: 20 tablet, Rfl: 0 .  protriptyline (VIVACTIL) 10 MG tablet, Take 1 tablet (10 mg total) by mouth at bedtime., Disp: 30 tablet, Rfl: 11 .  SUMAtriptan (IMITREX) 100 MG tablet, Take 1 tablet (100 mg total) by mouth once as needed for migraine. May repeat in 2 hours if headache persists or recurs., Disp: 10 tablet, Rfl: 5  PAST MEDICAL HISTORY: Past Medical History  Diagnosis Date  . Seizures (HCC)     Petit mal  . Anxiety   . Depression   . Headache(784.0)   . Colitis 2003  . Narcolepsy   . Epilepsy (HCC)   . Migraines   . Hyperlipidemia   . Narcolepsy     PAST SURGICAL HISTORY: Past Surgical History  Procedure Laterality Date  . Adenoidectomy  1998  . Wrist surgery Right 2004  . Bilateral hip arthroscopy  2011, 2012    FAMILY HISTORY: Family History  Problem Relation Age of Onset  . Depression Mother   . Anxiety disorder Mother   . Depression Father   . Anxiety disorder Maternal Aunt   . Depression Maternal Aunt   . Bipolar disorder Maternal Uncle   .  Schizophrenia Maternal Grandfather   . Bipolar disorder Cousin     SOCIAL HISTORY:  Social History   Social History  . Marital Status: Single    Spouse Name: N/A  . Number of Children: N/A  . Years of Education: N/A   Occupational History  . Not on file.   Social History Main Topics  . Smoking status: Never Smoker   . Smokeless tobacco: Never Used  . Alcohol Use: 0.6 oz/week    1 Glasses of wine per week     Comment: Rare  . Drug Use: No  . Sexual Activity: No   Other Topics Concern  . Not on file   Social History Narrative     PHYSICAL EXAM  Filed Vitals:   02/08/15 1036  BP: 106/66  Pulse: 66    General: The patient is well-developed and well-nourished and  in no acute distress   Musculoskeletal:   She is tender over piriformis muscles, worse on the left. No lumbar paraspinal tenderness. Range of motion of the back appeared normal.   Mild occipital tenderness.  Neurologic Exam  Mental status: The patient is alert and oriented x 3 at the time of the examination. The patient has apparent normal recent and remote memory, with an apparently normal attention span and concentration ability.   Speech is normal.  Cranial nerves: Extraocular movements are full.  Facial symmetry is present. There is good facial sensation to soft touch bilaterally.Facial strength is normal.  Trapezius and sternocleidomastoid strength is normal. No dysarthria is noted.    No obvious hearing deficits are noted.  Motor:  Muscle bulk is normal.   Tone is normal. Strength is  5 / 5 in all 4 extremities.   Sensory: Sensory testing is intact to pinprick, soft touch and vibration sensation in arms but decreased touch/pp in left L5 distribution.  Coordination: Cerebellar testing reveals good finger-nose-finger and heel-to-shin bilaterally.  Gait and station: Station is normal.   Gait is normal. Tandem gait is normal.    Reflexes: Deep tendon reflexes are symmetric and normal bilaterally.        DIAGNOSTIC DATA (LABS, IMAGING, TESTING) - I reviewed patient records, labs, notes, testing and imaging myself where available.  Lab Results  Component Value Date   WBC 10.3 05/03/2013   HGB 12.3 05/03/2013   HCT 36.5 05/03/2013   MCV 91.7 05/03/2013   PLT 200 05/03/2013      Component Value Date/Time   NA 137 05/03/2013 1358   K 4.4 05/03/2013 1358   CL 96 05/03/2013 1358   CO2 28 05/03/2013 1358   GLUCOSE 111* 05/03/2013 1358   BUN 11 05/03/2013 1358   CREATININE 0.69 05/03/2013 1358   CALCIUM 9.8 05/03/2013 1358   PROT 7.3 05/03/2013 1358   ALBUMIN 4.1 05/03/2013 1358   AST 21 05/03/2013 1358   ALT 13 05/03/2013 1358   ALKPHOS 82 05/03/2013 1358   BILITOT 0.6 05/03/2013 1358   GFRNONAA >90 05/03/2013 1358   GFRAA >90 05/03/2013 1358       ASSESSMENT AND PLAN  Nonintractable juvenile myoclonic epilepsy without status epilepticus (HCC)  Bilateral sciatica  Attention deficit disorder  Migraine without aura and without status migrainosus, not intractable    1.    Renew stimulants for narcolepsy.   Continue protriptyline 2.   Continue lamotrigine at current dose for JME 3.   Trigger point inject bilateral piriformis muscles with 80 mg Depo-Medrol in 5 mL Marcaine. She tolerated the procedure well and there were no complications. Return to clinic in 5 months or sooner if she has new or worsening neurologic symptoms.   Terrika Zuver A. Epimenio Foot, MD, PhD 02/08/2015, 10:55 AM Certified in Neurology, Clinical Neurophysiology, Sleep Medicine, Pain Medicine and Neuroimaging  [facility] [address] [place], [place] [address] [phone]

## 2015-04-03 ENCOUNTER — Telehealth: Payer: Self-pay | Admitting: *Deleted

## 2015-04-03 NOTE — Telephone Encounter (Signed)
LMTC.  Adderall has been denied so I'm trying to complete a PA--but am being told by [employer] that she now has [employer]BS--all I have for her is UMR.  Need a copy of her new ins. card./fim

## 2015-05-08 ENCOUNTER — Telehealth: Payer: Self-pay | Admitting: Neurology

## 2015-05-08 MED ORDER — AMPHETAMINE-DEXTROAMPHETAMINE 30 MG PO TABS: 30.0000 mg | ORAL_TABLET | Freq: Three times a day (TID) | ORAL | Status: DC

## 2015-05-08 NOTE — Telephone Encounter (Signed)
Rx. awaiting RAS sig/fim 

## 2015-05-08 NOTE — Telephone Encounter (Signed)
Pt called requesting refill for amphetamine-dextroamphetamine (ADDERALL) 30 MG tablet

## 2015-05-09 NOTE — Telephone Encounter (Signed)
Adderall rx. up front [facility]/fim 

## 2015-06-07 ENCOUNTER — Encounter: Payer: Self-pay | Admitting: Neurology

## 2015-06-07 ENCOUNTER — Ambulatory Visit (INDEPENDENT_AMBULATORY_CARE_PROVIDER_SITE_OTHER): Payer: BLUE CROSS/BLUE SHIELD | Admitting: Neurology

## 2015-06-07 VITALS — BP 114/70 | HR 68 | Resp 12 | Ht <= 58 in | Wt 110.0 lb

## 2015-06-07 DIAGNOSIS — F909 Attention-deficit hyperactivity disorder, unspecified type: Secondary | ICD-10-CM | POA: Diagnosis not present

## 2015-06-07 DIAGNOSIS — M5432 Sciatica, left side: Secondary | ICD-10-CM

## 2015-06-07 DIAGNOSIS — G40B09 Juvenile myoclonic epilepsy, not intractable, without status epilepticus: Secondary | ICD-10-CM | POA: Diagnosis not present

## 2015-06-07 DIAGNOSIS — G43009 Migraine without aura, not intractable, without status migrainosus: Secondary | ICD-10-CM | POA: Diagnosis not present

## 2015-06-07 DIAGNOSIS — G47411 Narcolepsy with cataplexy: Secondary | ICD-10-CM | POA: Diagnosis not present

## 2015-06-07 DIAGNOSIS — M5431 Sciatica, right side: Secondary | ICD-10-CM | POA: Diagnosis not present

## 2015-06-07 DIAGNOSIS — F988 Other specified behavioral and emotional disorders with onset usually occurring in childhood and adolescence: Secondary | ICD-10-CM

## 2015-06-07 MED ORDER — AMPHETAMINE-DEXTROAMPHETAMINE 30 MG PO TABS: 30.0000 mg | ORAL_TABLET | Freq: Three times a day (TID) | ORAL | Status: DC

## 2015-06-07 NOTE — Progress Notes (Signed)
[facility]  PATIENT: Abigail Lowery DOB: 1985-06-30  REFERRING DOCTOR OR PCP:  Adaku Nnodi SOURCE: patient  _________________________________   HISTORICAL  CHIEF COMPLAINT:  Chief Complaint  Patient presents with  . Seizures    Denies new sz. activity.  Last sz. was approx. 2 yrs. ago.  Denies missed doses of Lamictal.  Sts. ADD and migraines are well controlled with Addrerall, Protriptyline and Imitrex./fim  . ADD  . Migraines    HISTORY OF PRESENT ILLNESS:  Abigail Lowery is a 31 year old woman with juvenile myoclonic epilepsy and narcolepsy.   She also has migraine headaches and sciatica.     JME:    She denies any seizures since her last visit.   She is doing well on lamotrigine And tolerates it well..     She has not had any staring spells since last visit.   No GTC x 8 - 9 years.      She has not had any more morning myoclonus since going up on lamotrigine.     Seizure history:    She was diagnosed with JME around age 46. 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 Similar staring spells occurred and she had an EEG with a pattern consistent with juvenile myoclonic epilepsy. In the mornings, she would often have mild clonus. She still has mild clonus at times in the morning. At age 24, she had a single convulsive seizure. Initially, she was placed on Depakote but due to some breakthrough staring spells and poor tolerability, she was switched to Lamictal.   Narcolepsy/ADD:   Her sleepiness and attention are helped a lot by the stimulants. She is on Adderall 30 mg and takes 3 times a day. Sometimes, she will skip her third dose.    Protriptyline has helped the cataplexy and she has not had any since her last visit. Sleep paralysis are better.    She sleeps in late on weekends and gets between 5 hours to 10-12 hours a night sleep .    Narcolepsy History:  She was diagnosed with narcolepsy in 2008 after presenting with severe sleepiness. She had  PSG and MSLT in 2008 showing severe hypersomnia and sleep onset REM consistent with narcolepsy. She also had mild OSA on that initial sleep study.   A repeat PSG 06/28/2012 showed no significant OSA (AHI equals 0.2). She did have some snoring. She also had moderate periodic limb movements of sleep with an index of 25.3 and an arousal index of 11.9.. She was placed on stimulants and is currently on Adderall 30 mg 3 times a day. She tolerates this dose well. She feels her hypersomnia does well once she is awake.   She sets 3 alarms because she has trouble getting up many mornings. She gets very rare episodes of cataplexy once every month or 2 now.   She was having sleep paralysis about once a week but it is about 1/2 as much with protriptyline. She will have hypnagogic hallucinations at times.   Sciatica/Back pain:   She is noting bilateral buttock and leg pain.  Pain is mostly in the buttock and will go down the legs, left > right, at times.    Pain is similar to that pain that she experienced in the past. Pain is worse with prolonged sitting.    She receives benefit from piriformis trigger point injections, she gets about 3 shots/year with at least 3 months benefit each time.    MRI of the  lumbar spine a few years ago was normal.         Migraine Headache: She feels migraines are doing about the same. On a tpical month, she will get 3-5 migraine headaches, often associated with her period.    She will have photophobia and phonophobia and nausea (no vomiting) with the headaches. Moving will make the headaches worse. Usually she takes Motrin first and will take sumatriptan if not better after 30-40 minutes.  Sumatriptan has helped a lot.  [name]      REVIEW OF SYSTEMS: Constitutional: No fevers, chills, sweats, or change in appetite Eyes: No visual changes, double vision, eye pain Ear, nose and throat: No hearing loss, ear pain, nasal congestion, sore throat Cardiovascular: No chest pain,  palpitations Respiratory: No shortness of breath at rest or with exertion.   No wheezes GastrointestinaI: No nausea, vomiting, diarrhea, abdominal pain, fecal incontinence Genitourinary: No dysuria, urinary retention or frequency.  No nocturia. Musculoskeletal: No neck pain, back pain Integumentary: No rash, pruritus, skin lesions Neurological: as above Psychiatric: No depression at this time.  No anxiety Endocrine: No palpitations, diaphoresis, change in appetite, change in weigh or increased thirst Hematologic/Lymphatic: No anemia, purpura, petechiae. Allergic/Immunologic: No itchy/runny eyes, nasal congestion, recent allergic reactions, rashes  ALLERGIES: Allergies  Allergen Reactions  . Dust Mite Extract Itching  . Mold Extract [Trichophyton] Itching    HOME MEDICATIONS:  Current outpatient prescriptions:  .  amphetamine-dextroamphetamine (ADDERALL) 30 MG tablet, Take 1 tablet by mouth 3 (three) times daily. Fill on or after 03/10/2015, Disp: 90 tablet, Rfl: 0 .  FLUoxetine (PROZAC) 40 MG capsule, Take 40 mg by mouth daily., Disp: , Rfl:  .  lamoTRIgine (LAMICTAL) 200 MG tablet, TAKE 1 TABLET BY MOUTH TWICE A DAY., Disp: 180 tablet, Rfl: 1 .  LORazepam (ATIVAN) 0.5 MG tablet, Take 0.5 mg by mouth every 8 (eight) hours as needed for anxiety., Disp: , Rfl:  .  promethazine (PHENERGAN) 25 MG tablet, Take 1 tablet (25 mg total) by mouth every 6 (six) hours as needed for nausea., Disp: 20 tablet, Rfl: 0 .  protriptyline (VIVACTIL) 10 MG tablet, Take 1 tablet (10 mg total) by mouth at bedtime., Disp: 30 tablet, Rfl: 11 .  SUMAtriptan (IMITREX) 100 MG tablet, Take 1 tablet (100 mg total) by mouth once as needed for migraine. May repeat in 2 hours if headache persists or recurs., Disp: 10 tablet, Rfl: 5  PAST MEDICAL HISTORY: Past Medical History  Diagnosis Date  . Seizures (HCC)     Petit mal  . Anxiety   . Depression   . Headache(784.0)   . Colitis 2003  . Narcolepsy   .  Epilepsy (HCC)   . Migraines   . Hyperlipidemia   . Narcolepsy     PAST SURGICAL HISTORY: Past Surgical History  Procedure Laterality Date  . Adenoidectomy  1998  . Wrist surgery Right 2004  . Bilateral hip arthroscopy  2011, 2012    FAMILY HISTORY: Family History  Problem Relation Age of Onset  . Depression Mother   . Anxiety disorder Mother   . Depression Father   . Anxiety disorder Maternal Aunt   . Depression Maternal Aunt   . Bipolar disorder Maternal Uncle   . Schizophrenia Maternal Grandfather   . Bipolar disorder Cousin     SOCIAL HISTORY:  Social History   Social History  . Marital Status: Single    Spouse Name: N/A  . Number of Children: N/A  . Years of Education:  N/A   Occupational History  . Not on file.   Social History Main Topics  . Smoking status: Never Smoker   . Smokeless tobacco: Never Used  . Alcohol Use: 0.6 oz/week    1 Glasses of wine per week     Comment: Rare  . Drug Use: No  . Sexual Activity: No   Other Topics Concern  . Not on file   Social History Narrative     PHYSICAL EXAM  Filed Vitals:   06/07/15 0957  BP: 114/70  Pulse: 68  Resp: 12    General: The patient is well-developed and well-nourished and in no acute distress   Musculoskeletal:   She is tender over piriformis muscles, worse on the left. No lumbar paraspinal tenderness. Range of motion of the back appeared normal.   Mild occipital tenderness.  Neurologic Exam  Mental status: The patient is alert and oriented x 3 at the time of the examination. The patient has apparent normal recent and remote memory, with an apparently normal attention span and concentration ability.   Speech is normal.  Cranial nerves: Extraocular movements are full.  Facial symmetry is present. There is good facial sensation to soft touch bilaterally.Facial strength is normal.  Trapezius and sternocleidomastoid strength is normal. No dysarthria is noted.    No obvious hearing deficits  are noted.  Motor:  Muscle bulk is normal.   Tone is normal. Strength is  5 / 5 in all 4 extremities.   Sensory: Sensory testing is intact to pinprick, soft touch and vibration sensation in arms but decreased touch/pp in left L5 distribution.  Coordination: Cerebellar testing reveals good finger-nose-finger and heel-to-shin bilaterally.  Gait and station: Station is normal.   Gait is normal. Tandem gait is normal.    Reflexes: Deep tendon reflexes are symmetric and normal bilaterally.       DIAGNOSTIC DATA (LABS, IMAGING, TESTING) - I reviewed patient records, labs, notes, testing and imaging myself where available.  Lab Results  Component Value Date   WBC 10.3 05/03/2013   HGB 12.3 05/03/2013   HCT 36.5 05/03/2013   MCV 91.7 05/03/2013   PLT 200 05/03/2013      Component Value Date/Time   NA 137 05/03/2013 1358   K 4.4 05/03/2013 1358   CL 96 05/03/2013 1358   CO2 28 05/03/2013 1358   GLUCOSE 111* 05/03/2013 1358   BUN 11 05/03/2013 1358   CREATININE 0.69 05/03/2013 1358   CALCIUM 9.8 05/03/2013 1358   PROT 7.3 05/03/2013 1358   ALBUMIN 4.1 05/03/2013 1358   AST 21 05/03/2013 1358   ALT 13 05/03/2013 1358   ALKPHOS 82 05/03/2013 1358   BILITOT 0.6 05/03/2013 1358   GFRNONAA >90 05/03/2013 1358   GFRAA >90 05/03/2013 1358       ASSESSMENT AND PLAN  Nonintractable juvenile myoclonic epilepsy without status epilepticus (HCC)  Bilateral sciatica  Migraine without aura and without status migrainosus, not intractable  Narcolepsy and cataplexy  Attention deficit disorder    1.    Renew Adderall 30 mg po tid for narcolepsy.   Continue protriptyline 2.   Continue lamotrigine at current dose for JME 3.   Trigger point inject bilateral piriformis muscles with 80 mg Depo-Medrol in 5 mL Marcaine. She tolerated the procedure well and there were no complications.  Return to clinic in 4 months or sooner if she has new or worsening neurologic symptoms.   Haliegh Khurana  A. Epimenio FootSater, MD, PhD 06/07/2015, 10:04 AM Certified in  Neurology, Clinical Neurophysiology, Sleep Medicine, Pain Medicine and Neuroimaging  [facility] [address], [address] [place], [place] [address] [phone]

## 2015-09-06 ENCOUNTER — Telehealth: Payer: Self-pay | Admitting: Neurology

## 2015-09-06 MED ORDER — AMPHETAMINE-DEXTROAMPHETAMINE 30 MG PO TABS: 30.0000 mg | ORAL_TABLET | Freq: Three times a day (TID) | ORAL | Status: DC

## 2015-09-06 NOTE — Telephone Encounter (Signed)
Adderall rx. up front [facility]/fim 

## 2015-09-06 NOTE — Telephone Encounter (Signed)
Patient requesting refill of amphetamine-dextroamphetamine (ADDERALL) 30 MG tablet  Pharmacy: pick up

## 2015-09-06 NOTE — Telephone Encounter (Signed)
Rx. awaiting YY's sig, as RAS is ooo this week/fim

## 2015-10-11 ENCOUNTER — Ambulatory Visit (INDEPENDENT_AMBULATORY_CARE_PROVIDER_SITE_OTHER): Payer: BLUE CROSS/BLUE SHIELD | Admitting: Neurology

## 2015-10-11 ENCOUNTER — Encounter: Payer: Self-pay | Admitting: Neurology

## 2015-10-11 VITALS — BP 104/76 | HR 68 | Resp 12 | Ht <= 58 in | Wt 104.0 lb

## 2015-10-11 DIAGNOSIS — F909 Attention-deficit hyperactivity disorder, unspecified type: Secondary | ICD-10-CM

## 2015-10-11 DIAGNOSIS — G43009 Migraine without aura, not intractable, without status migrainosus: Secondary | ICD-10-CM | POA: Diagnosis not present

## 2015-10-11 DIAGNOSIS — M5432 Sciatica, left side: Secondary | ICD-10-CM

## 2015-10-11 DIAGNOSIS — M5431 Sciatica, right side: Secondary | ICD-10-CM | POA: Diagnosis not present

## 2015-10-11 DIAGNOSIS — G47411 Narcolepsy with cataplexy: Secondary | ICD-10-CM | POA: Diagnosis not present

## 2015-10-11 DIAGNOSIS — G40B09 Juvenile myoclonic epilepsy, not intractable, without status epilepticus: Secondary | ICD-10-CM

## 2015-10-11 DIAGNOSIS — F988 Other specified behavioral and emotional disorders with onset usually occurring in childhood and adolescence: Secondary | ICD-10-CM

## 2015-10-11 MED ORDER — SUMATRIPTAN SUCCINATE 100 MG PO TABS
100.0000 mg | ORAL_TABLET | Freq: Once | ORAL | 11 refills | Status: DC | PRN
Start: 2015-10-11 — End: 2016-12-24

## 2015-10-11 MED ORDER — AMPHETAMINE-DEXTROAMPHETAMINE 30 MG PO TABS: 30.0000 mg | ORAL_TABLET | Freq: Three times a day (TID) | ORAL | 0 refills | Status: DC

## 2015-10-11 NOTE — Progress Notes (Signed)
[facility]  PATIENT: Abigail Lowery DOB: 1985-09-15  REFERRING DOCTOR OR PCP:  Adaku Nnodi SOURCE: patient  _________________________________   HISTORICAL  CHIEF COMPLAINT:  Chief Complaint  Patient presents with  . Seizures    Denies sz. activity since last ov and sts. is compliant with Lamictal.  Sts. she continues to have 3-4 migraines per month, milder h/a's several times a week.  Imitrex helps.  Sts. continues to have lower back pain and would like tpi's today if appropriate/fim  . Narcolepsy  . Migraines  . Back Pain    HISTORY OF PRESENT ILLNESS:  Abigail Lowery is a 30 year old woman with juvenile myoclonic epilepsy , narcolepsy, sciatica and miigraine headaches and sciatica.     JME:    She denies any seizures since her last visit.   She is doing well on lamotrigine And tolerates it well..     She has not had any staring spells since 2015-2016.   No GTC since 2008-2009.      She has not had any more morning myoclonus since going up on lamotrigine.   She tolerates lamotrigine very well.  Seizure history:    She was diagnosed with JME around age 30. 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 Similar staring spells occurred and she had an EEG with a pattern consistent with juvenile myoclonic epilepsy. In the mornings, she would often have mild clonus. She still has mild clonus at times in the morning. At age 30, she had a single convulsive seizure. Initially, she was placed on Depakote but due to some breakthrough staring spells and poor tolerability, she was switched to Lamictal.   Narcolepsy/ADD:   Her sleepiness and attention are helped a lot by the stimulants. She is on Adderall 30 mg and takes 3 times a day. Sometimes, she will skip her third dose.    Protriptyline has helped the cataplexy and the sleep paralysis.   She has had only one episode since her last visit.  On weekdays, she sleeps 10 hours and sleeps 12 hours on weekends .     Narcolepsy History:  She was diagnosed with narcolepsy in 2008 after presenting with severe sleepiness. She had PSG and MSLT in 2008 showing severe hypersomnia and sleep onset REM consistent with narcolepsy. She also had mild OSA on that initial sleep study.   A repeat PSG 06/28/2012 showed no significant OSA (AHI equals 0.2). She did have some snoring. She also had moderate periodic limb movements of sleep with an index of 25.3 and an arousal index of 11.9.. She was placed on stimulants and is currently on Adderall 30 mg 3 times a day. She tolerates this dose well. She feels her hypersomnia does well once she is awake.   She sets 3 alarms because she has trouble getting up many mornings. She gets very rare episodes of cataplexy.   Sleep paralysis events have been helped by protriptyline.[name]. She will have hypnagogic hallucinations at times.   Sciatica/Back pain:   She has bilateral buttock and leg pain.  Pain is mostly in the buttock and will radiate into the legs, left > right.   Current pain is similar to that pain that she experienced in the past. Pain is worse with prolonged sitting.    She receives benefit from piriformis trigger point injections, she gets about 3 shots/year with at least 3 months benefit each time.  They usually start to wear off between 3 and 4 months.  MRI of the lumbar spine around 2013 was normal.         Migraine Headache:  On a tpical month, she will get 3-4 migraine headaches (lasting 1-3 days), often associated with her period (at least one of the migraine).    She will have photophobia and phonophobia and nausea (no vomiting) with the headaches. Moving will make the headaches worse. Usually she takes Ibuprofin with sumatriptan with benefit.      REVIEW OF SYSTEMS: Constitutional: No fevers, chills, sweats, or change in appetite Eyes: No visual changes, double vision, eye pain Ear, nose and throat: No hearing loss, ear pain, nasal congestion, sore  throat Cardiovascular: No chest pain, palpitations Respiratory: No shortness of breath at rest or with exertion.   No wheezes GastrointestinaI: No nausea, vomiting, diarrhea, abdominal pain, fecal incontinence Genitourinary: No dysuria, urinary retention or frequency.  No nocturia. Musculoskeletal: No neck pain.   Back pain as above Integumentary: No rash, pruritus, skin lesions Neurological: as above Psychiatric: No depression at this time.  No anxiety Endocrine: No palpitations, diaphoresis, change in appetite, change in weigh or increased thirst Hematologic/Lymphatic: No anemia, purpura, petechiae. Allergic/Immunologic: No itchy/runny eyes, nasal congestion, recent allergic reactions, rashes  ALLERGIES: Allergies  Allergen Reactions  . Dust Mite Extract Itching  . Mold Extract [Trichophyton] Itching    HOME MEDICATIONS:  Current Outpatient Prescriptions:  .  amphetamine-dextroamphetamine (ADDERALL) 30 MG tablet, Take 1 tablet by mouth 3 (three) times daily. Fill on or after 10/11/2015, Disp: 90 tablet, Rfl: 0 .  FLUoxetine (PROZAC) 40 MG capsule, Take 40 mg by mouth daily., Disp: , Rfl:  .  lamoTRIgine (LAMICTAL) 200 MG tablet, TAKE 1 TABLET BY MOUTH TWICE A DAY., Disp: 180 tablet, Rfl: 1 .  LORazepam (ATIVAN) 0.5 MG tablet, Take 0.5 mg by mouth every 8 (eight) hours as needed for anxiety., Disp: , Rfl:  .  promethazine (PHENERGAN) 25 MG tablet, Take 1 tablet (25 mg total) by mouth every 6 (six) hours as needed for nausea., Disp: 20 tablet, Rfl: 0 .  protriptyline (VIVACTIL) 10 MG tablet, Take 1 tablet (10 mg total) by mouth at bedtime., Disp: 30 tablet, Rfl: 11 .  SUMAtriptan (IMITREX) 100 MG tablet, Take 1 tablet (100 mg total) by mouth once as needed for migraine. May repeat in 2 hours if headache persists or recurs., Disp: 10 tablet, Rfl: 11  PAST MEDICAL HISTORY: Past Medical History:  Diagnosis Date  . Anxiety   . Colitis 2003  . Depression   . Epilepsy (HCC)   .  Headache(784.0)   . Hyperlipidemia   . Migraines   . Narcolepsy   . Narcolepsy   . Seizures (HCC)    Petit mal    PAST SURGICAL HISTORY: Past Surgical History:  Procedure Laterality Date  . ADENOIDECTOMY  1998  . BILATERAL HIP ARTHROSCOPY  2011, 2012  . WRIST SURGERY Right 2004    FAMILY HISTORY: Family History  Problem Relation Age of Onset  . Depression Mother   . Anxiety disorder Mother   . Depression Father   . Anxiety disorder Maternal Aunt   . Depression Maternal Aunt   . Bipolar disorder Maternal Uncle   . Schizophrenia Maternal Grandfather   . Bipolar disorder Cousin     SOCIAL HISTORY:  Social History   Social History  . Marital status: Single    Spouse name: N/A  . Number of children: N/A  . Years of education: N/A   Occupational History  . Not on  file.   Social History Main Topics  . Smoking status: Never Smoker  . Smokeless tobacco: Never Used  . Alcohol use 0.6 oz/week    1 Glasses of wine per week     Comment: Rare  . Drug use: No  . Sexual activity: No   Other Topics Concern  . Not on file   Social History Narrative  . No narrative on file     PHYSICAL EXAM  Vitals:   10/11/15 0950  BP: 104/76  Pulse: 68  Resp: 12    General: The patient is well-developed and well-nourished and in no acute distress   Musculoskeletal:   She is tender over piriformis muscles, worse on the left. No lumbar paraspinal tenderness. No tenderness over trochanteric bursae. Range of motion of the back appeared normal.   Mild occipital tenderness.  Neurologic Exam  Mental status: The patient is alert and oriented x 3 at the time of the examination. The patient has apparent normal recent and remote memory, with an apparently normal attention span and concentration ability.   Speech is normal.  Cranial nerves: Extraocular movements are full.  Facial symmetry is present. There is good facial sensation to soft touch bilaterally.Facial strength is normal.   Trapezius and sternocleidomastoid strength is normal. No dysarthria is noted.    No obvious hearing deficits are noted.  Motor:  Muscle bulk is normal.   Tone is normal. Strength is  5 / 5 in all 4 extremities.   Sensory: Sensory testing is intact to pinprick, soft touch and vibration sensation in arms but decreased touch/pp in left L5 distribution.  Coordination: Cerebellar testing reveals good finger-nose-finger and heel-to-shin bilaterally.  Gait and station: Station is normal.   Gait is normal. Tandem gait is normal.    Reflexes: Deep tendon reflexes are symmetric and normal bilaterally.       DIAGNOSTIC DATA (LABS, IMAGING, TESTING) - I reviewed patient records, labs, notes, testing and imaging myself where available.  Lab Results  Component Value Date   WBC 10.3 05/03/2013   HGB 12.3 05/03/2013   HCT 36.5 05/03/2013   MCV 91.7 05/03/2013   PLT 200 05/03/2013      Component Value Date/Time   NA 137 05/03/2013 1358   K 4.4 05/03/2013 1358   CL 96 05/03/2013 1358   CO2 28 05/03/2013 1358   GLUCOSE 111 (H) 05/03/2013 1358   BUN 11 05/03/2013 1358   CREATININE 0.69 05/03/2013 1358   CALCIUM 9.8 05/03/2013 1358   PROT 7.3 05/03/2013 1358   ALBUMIN 4.1 05/03/2013 1358   AST 21 05/03/2013 1358   ALT 13 05/03/2013 1358   ALKPHOS 82 05/03/2013 1358   BILITOT 0.6 05/03/2013 1358   GFRNONAA >90 05/03/2013 1358   GFRAA >90 05/03/2013 1358       ASSESSMENT AND PLAN  Nonintractable juvenile myoclonic epilepsy without status epilepticus (HCC)  Bilateral sciatica  Migraine without aura and without status migrainosus, not intractable  Narcolepsy and cataplexy  Attention deficit disorder   1.    Renew Adderall 30 mg po tid for narcolepsy.   Continue protriptyline for cataplexy and sleep paralysis 2.   Continue lamotrigine at current dose for JME.   She also feels mood has been better on the higher dose. 3.   Trigger point inject bilateral piriformis muscles with 80  mg Depo-Medrol in 5 mL Marcaine. She tolerated the procedure well and there were no complications.  Return to clinic in 4 months or sooner if she  has new or worsening neurologic symptoms.   Adonis Ryther A. Epimenio Foot, MD, PhD 10/11/2015, 10:22 AM Certified in Neurology, Clinical Neurophysiology, Sleep Medicine, Pain Medicine and Neuroimaging  [facility] [address], [address] [place], [place] [address] [phone]

## 2015-11-21 IMAGING — CT CT ABD-PELV W/ CM
1 of 2 series · 15 of 32 positions shown, 19 images · IV contrast (OMNIPAQUE 300)
Comparison: DG FLUORO GUIDE NDL ALDEAN/BX dated 09/06/2009; CT ABD W/CM
dated 09/03/2007

CLINICAL DATA: Lower pelvic pain which extends to the right lower
quadrant 1 pain.

EXAM:
CT ABDOMEN AND PELVIS WITH CONTRAST
TECHNIQUE: Multidetector CT imaging of the abdomen and pelvis was performed
using the standard protocol following bolus administration of
intravenous contrast.
CONTRAST:  80mL OMNIPAQUE IOHEXOL 300 MG/ML  SOLN

[Series 2: abd/pel with · axial · 0.78mm/px · z∈[+10,+360]mm · 15 of 78 slices shown, 19 images]
[im 4/78  soft-tissue]
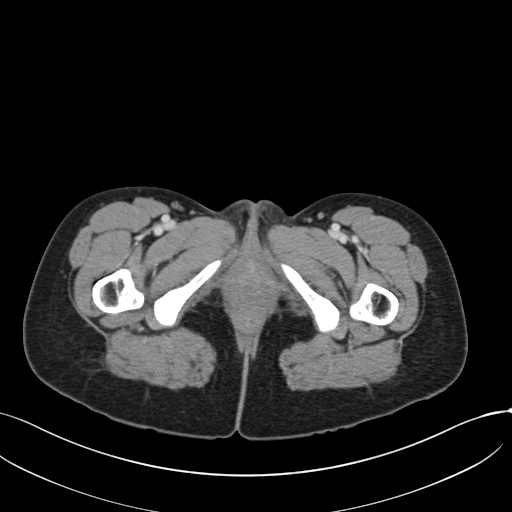
[im 4/78  bone]
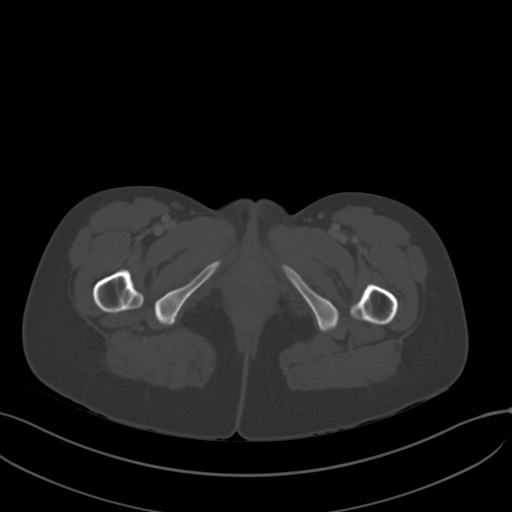
[im 11/78  soft-tissue]
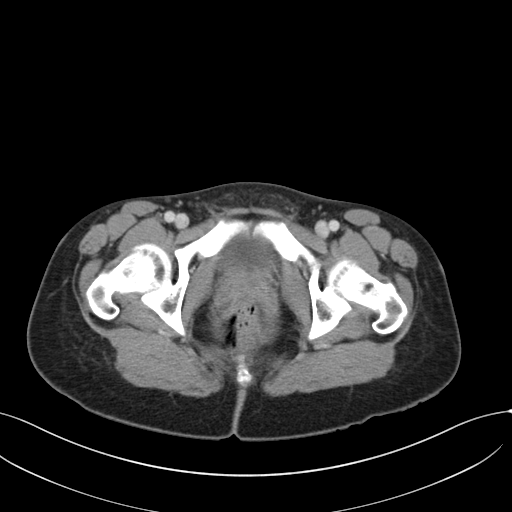
[im 17/78  soft-tissue]
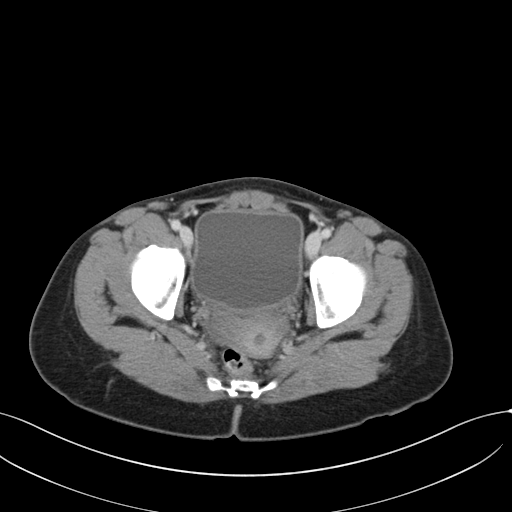
[im 21/78  soft-tissue]
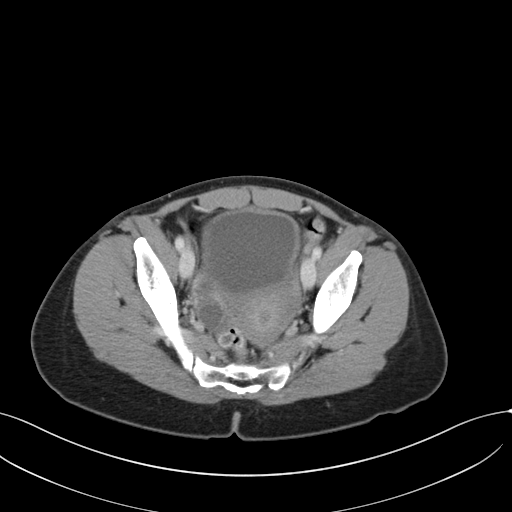
[im 27/78  soft-tissue]
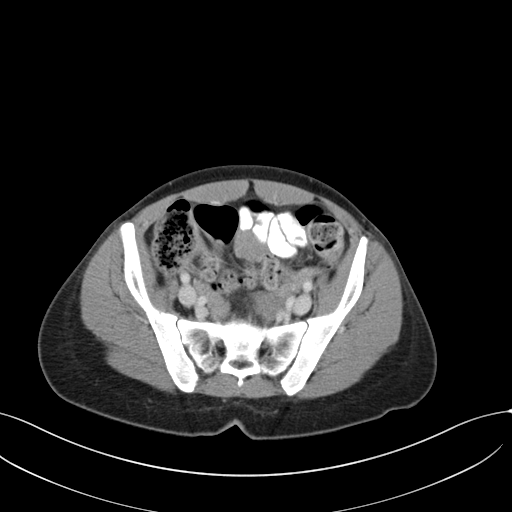
[im 34/78  soft-tissue]
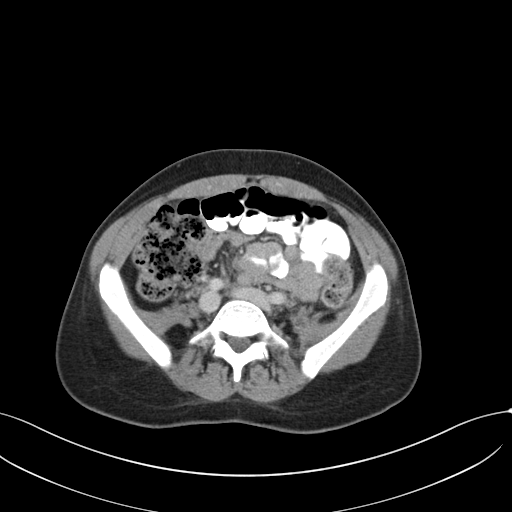
[im 41/78  soft-tissue]
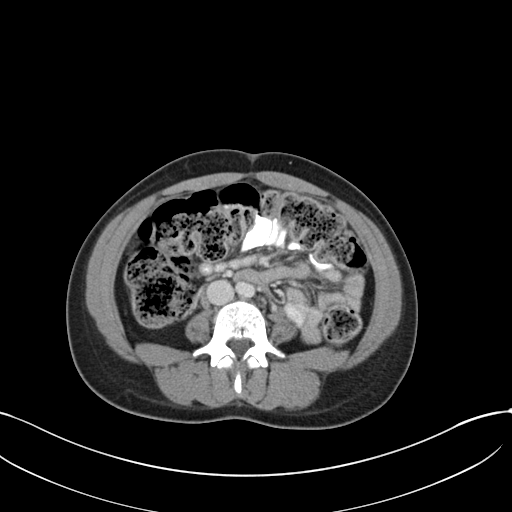
[im 44/78  soft-tissue]
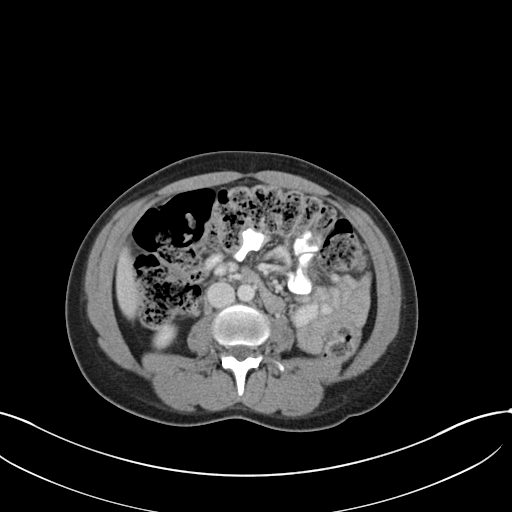
[im 51/78  soft-tissue]
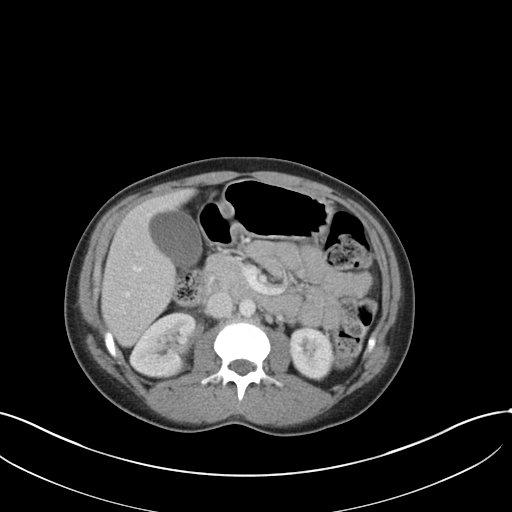
[im 51/78  bone]
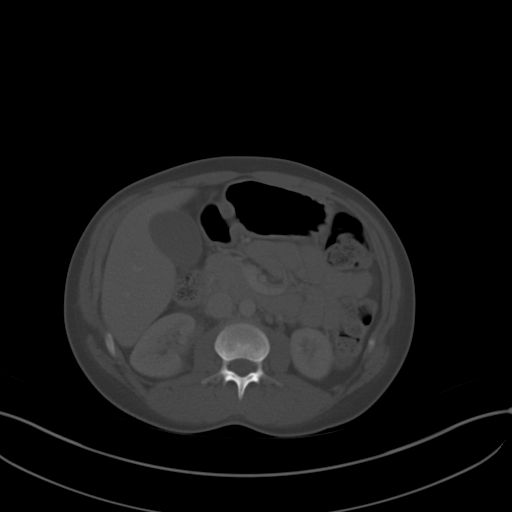
[im 57/78  soft-tissue]
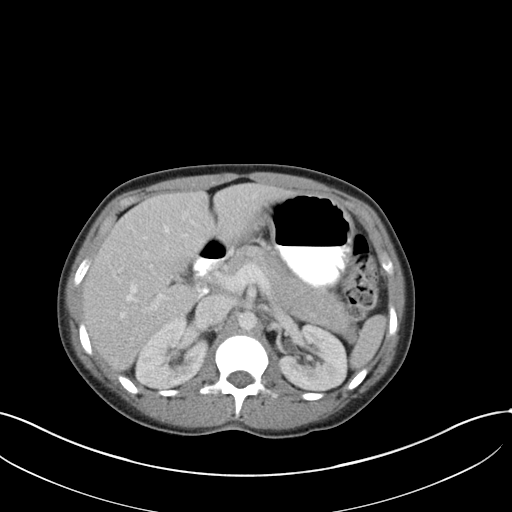
[im 61/78  soft-tissue]
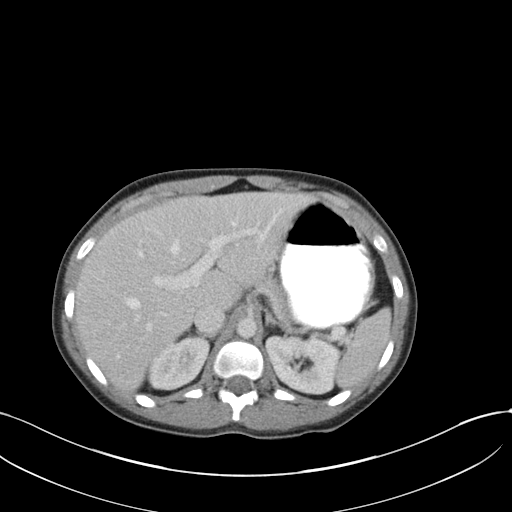
[im 64/78  lung]
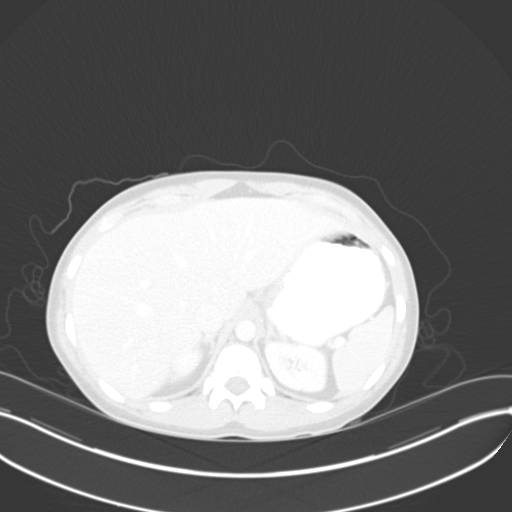
[im 67/78  soft-tissue]
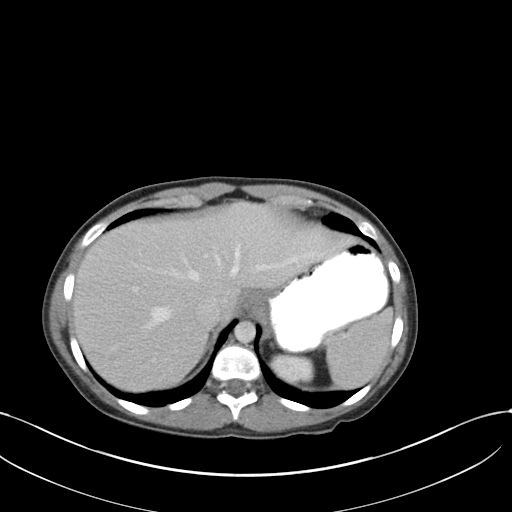
[im 67/78  lung]
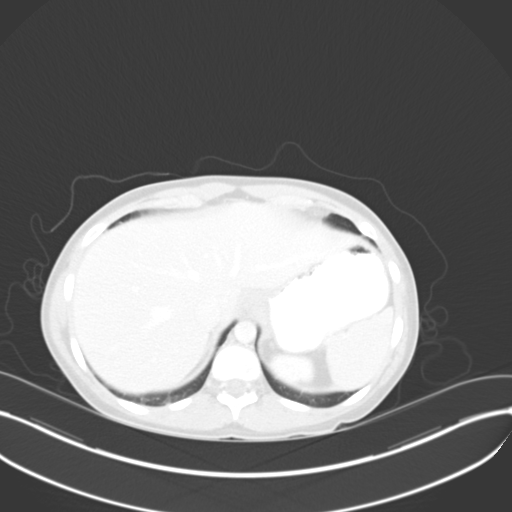
[im 71/78  lung]
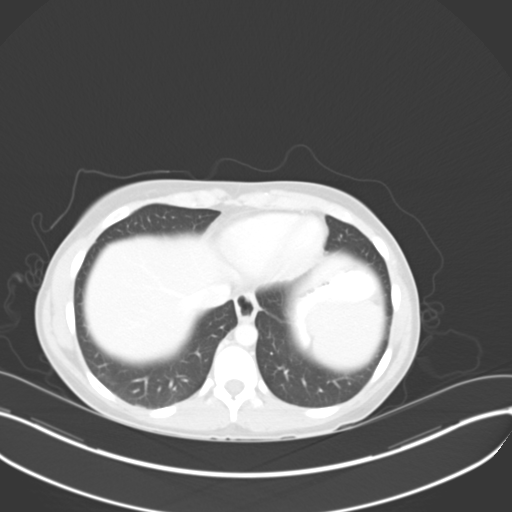
[im 74/78  soft-tissue]
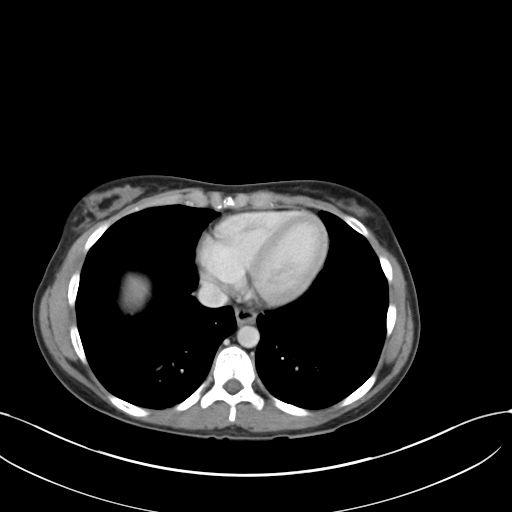
[im 74/78  lung]
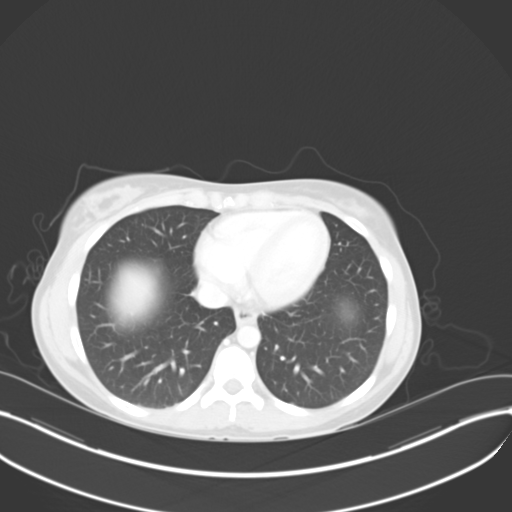

[15 of 32 positions shown; findings below may reference images not displayed]

FINDINGS: Lung bases are clear.  No pericardial fluid.

No focal hepatic lesion. The gallbladder, pancreas, spleen, adrenal
glands, and kidneys are normal. There is mild enhancement of the
urothelium of the right ureter (image 30, series 2). The is no
obstructing calculus evident.

Stomach, small bowel, appendix and cecum normal. There is a large
volume stool throughout ascending transverse and descending colon.
The rectosigmoid colon appears normal.

Abdominal or is normal caliber. No retroperitoneal periportal
lymphadenopathy.

No fluid the pelvis. The uterus is normal. There are 2 dominant
follicles within the right ovary measuring 1.7 cm each. The bladder
appears normal. No pelvic lymphadenopathy. No aggressive osseous
lesion.
IMPRESSION: 1. Normal appendix.
2. Mild enhancement of the urothelium of the right ureter without
obstructing lesion identified. This could indicate urinary tract
infection. Recommend clinical correlation. No CT evidence of
pyelonephritis.
3. Moderate volume of stool throughout the colon suggests
constipation

## 2015-11-22 ENCOUNTER — Telehealth: Payer: Self-pay | Admitting: Neurology

## 2015-11-22 NOTE — Telephone Encounter (Signed)
Patient called to request refill of amphetamine-dextroamphetamine (ADDERALL) 30 MG tablet °

## 2015-11-23 MED ORDER — AMPHETAMINE-DEXTROAMPHETAMINE 30 MG PO TABS: 30.0000 mg | ORAL_TABLET | Freq: Three times a day (TID) | ORAL | 0 refills | Status: DC

## 2015-11-23 NOTE — Telephone Encounter (Signed)
Rx. awaiting RAS sig/fim 

## 2015-11-23 NOTE — Telephone Encounter (Signed)
Adderall rx. up front [facility]/fim 

## 2015-12-28 ENCOUNTER — Telehealth: Payer: Self-pay | Admitting: Neurology

## 2015-12-28 NOTE — Telephone Encounter (Signed)
Patient called to request refill of amphetamine-dextroamphetamine (ADDERALL) 30 MG tablet °

## 2016-01-01 MED ORDER — AMPHETAMINE-DEXTROAMPHETAMINE 30 MG PO TABS: 30.0000 mg | ORAL_TABLET | Freq: Three times a day (TID) | ORAL | 0 refills | Status: DC

## 2016-01-01 NOTE — Telephone Encounter (Signed)
Adderall rx. up front [facility]/fim 

## 2016-01-01 NOTE — Telephone Encounter (Signed)
Rx. awaiting RAS sig/fim 

## 2016-01-19 IMAGING — US US SOFT TISSUE HEAD/NECK
1 series · 14 of 25 positions shown · non-contrast
Comparison: 06/25/2010

CLINICAL DATA: Follow-up nodules

EXAM:
THYROID ULTRASOUND
TECHNIQUE: Ultrasound examination of the thyroid gland and adjacent soft
tissues was performed.

[Series 1: us soft tissue head/neck · 0.08mm/px · 14 of 50 slices shown]
[im 1/50]
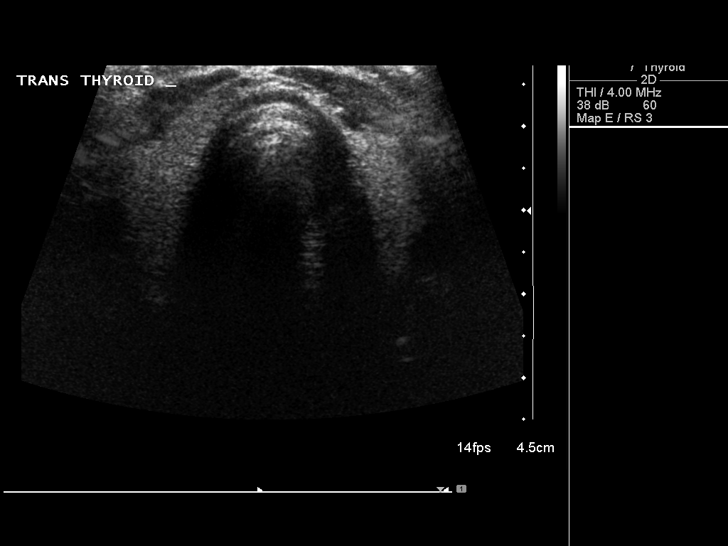
[im 5/50]
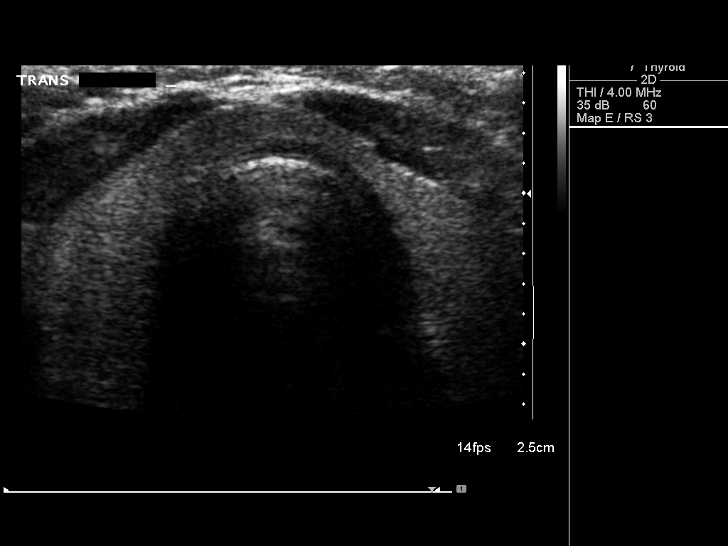
[im 9/50]
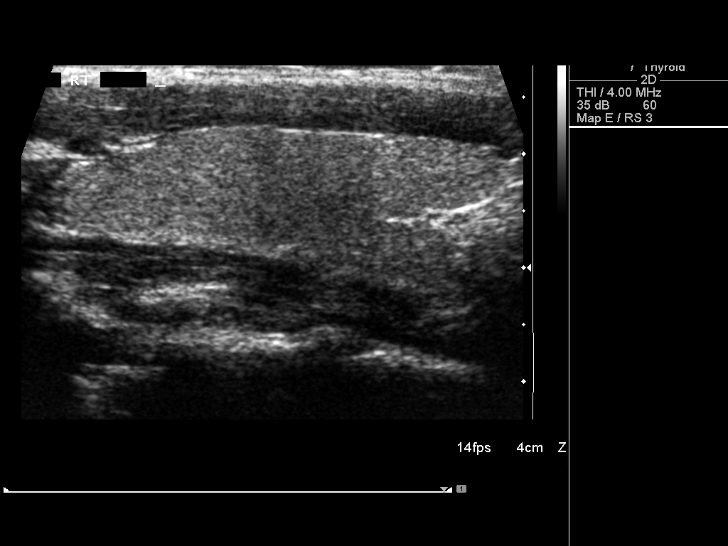
[im 13/50]
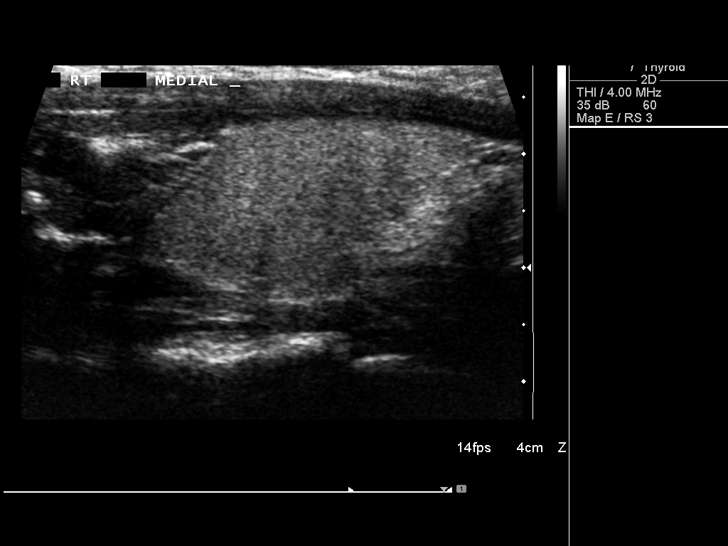
[im 17/50]
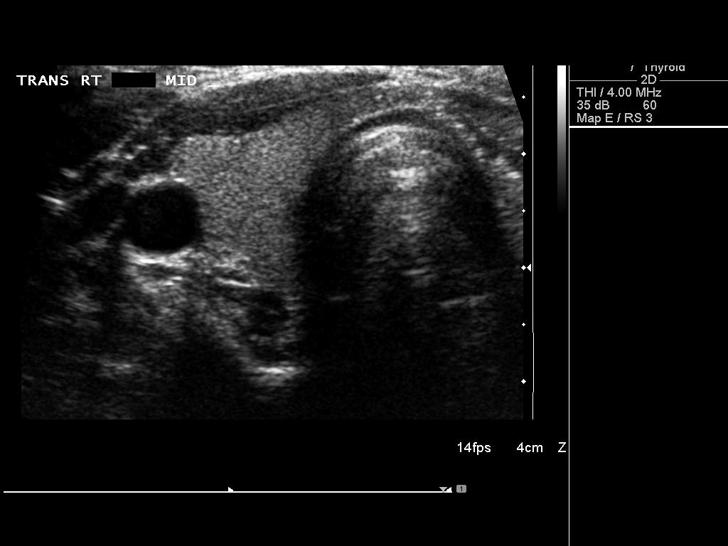
[im 19/50]
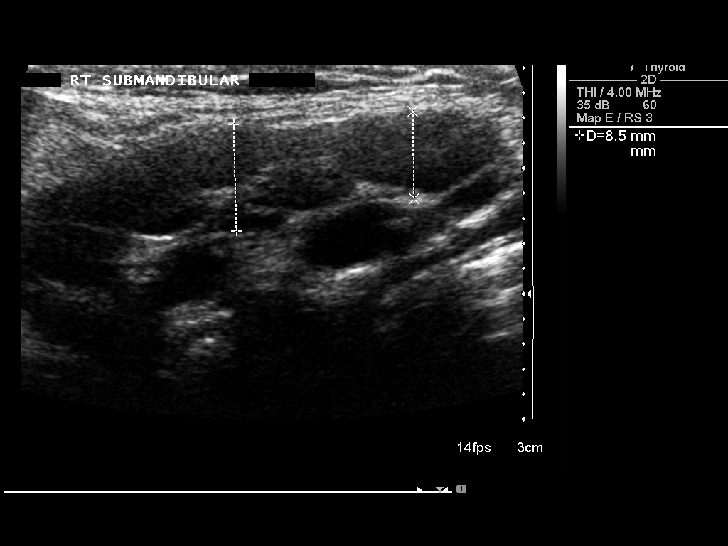
[im 23/50]
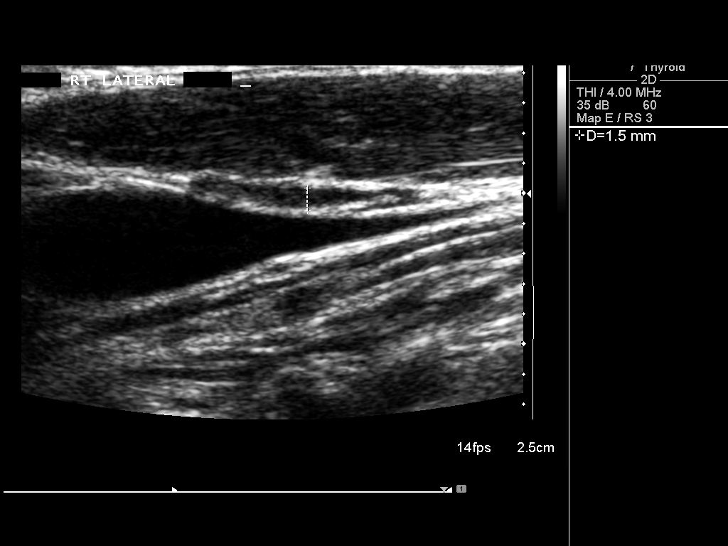
[im 27/50]
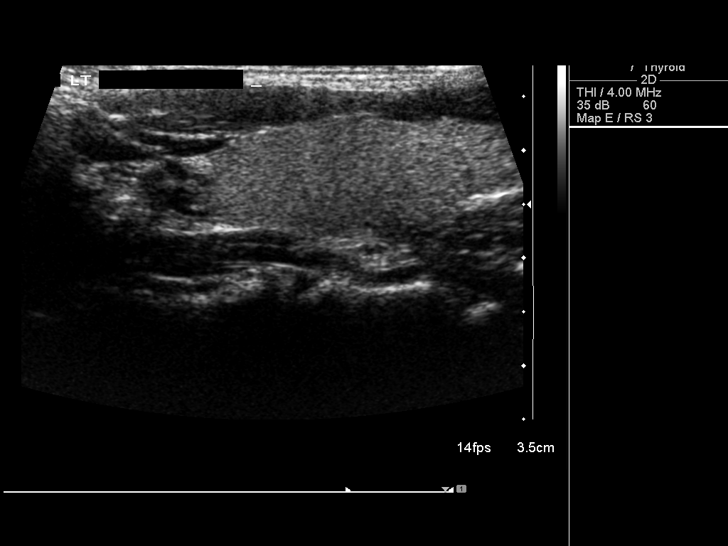
[im 31/50]
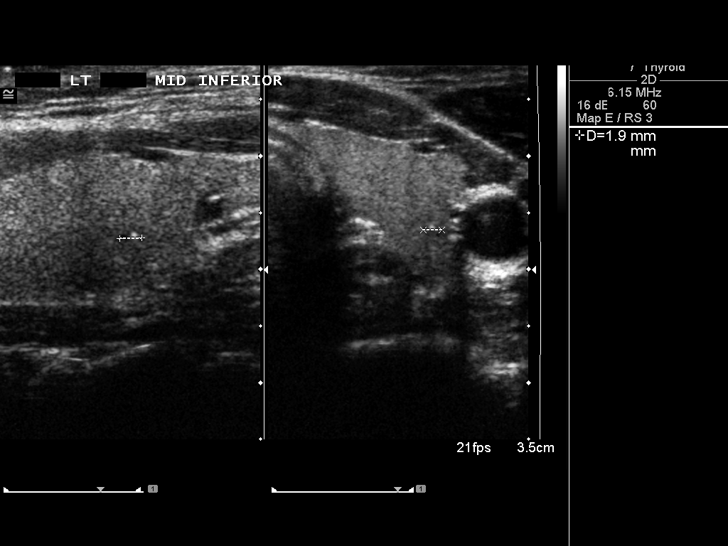
[im 33/50]
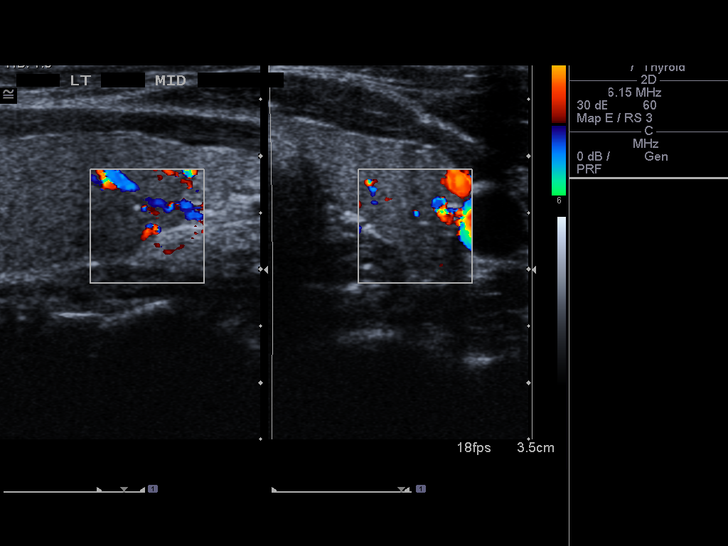
[im 37/50]
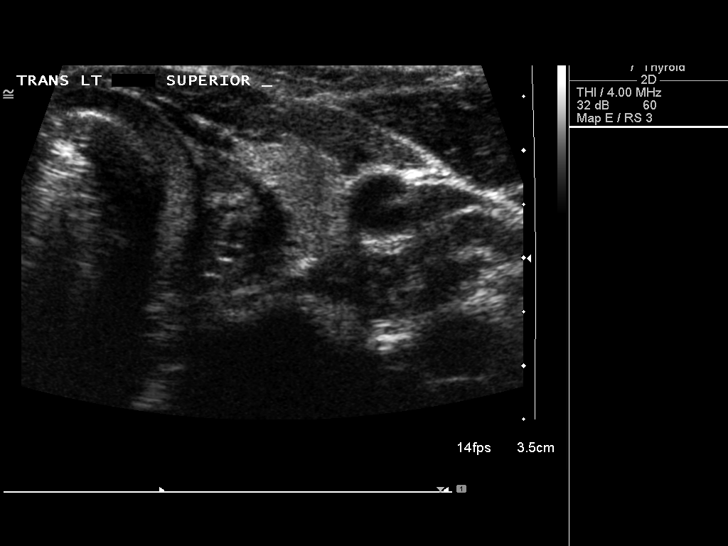
[im 41/50]
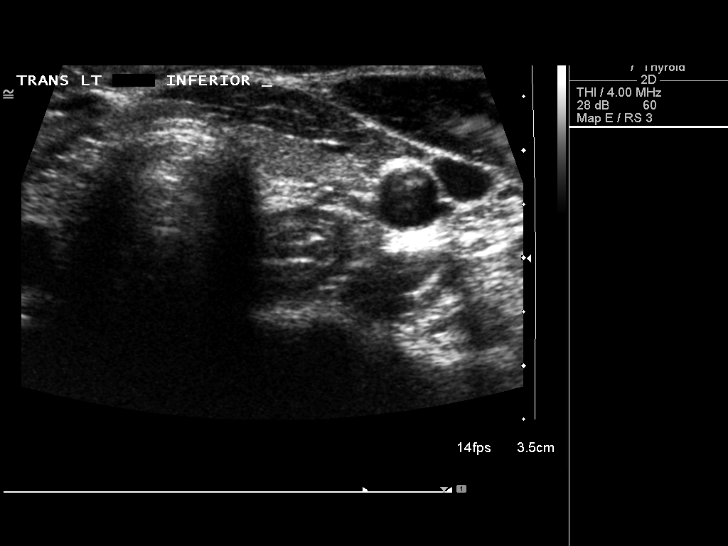
[im 45/50]
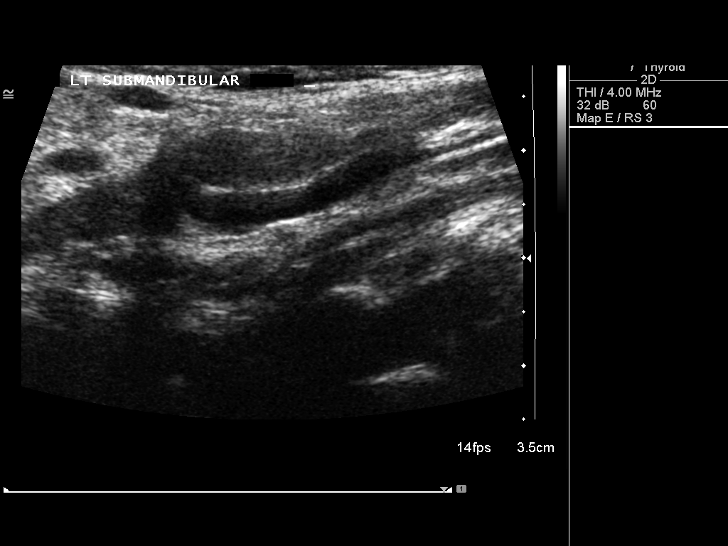
[im 50/50]
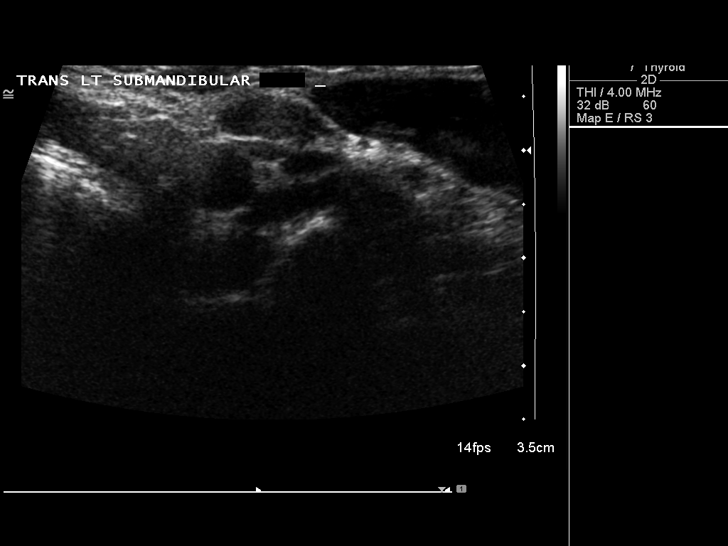

[14 of 25 positions shown; findings below may reference images not displayed]

FINDINGS: Right thyroid lobe

Measurements: 4.1 x 1.3 x 1.3 cm.  No nodules visualized.

Left thyroid lobe

Measurements: 3.9 x 1.3 x 1.3 cm. 2 mm left lower pole complex
nodule.

Isthmus

Thickness: 2 mm in thickness.  No nodules visualized.

Lymphadenopathy

None visualized.
IMPRESSION: 2 mm left lobe nodule. Findings do not meet current SRU consensus
criteria for biopsy. Follow-up by clinical exam is recommended. If
patient has known risk factors for thyroid carcinoma, consider
follow-up ultrasound in 12 months. If patient is clinically
hyperthyroid, consider nuclear medicine thyroid uptake and
scan.Reference: Management of Thyroid Nodules Detected at US:
Society of Radiologists in Ultrasound Consensus Conference

## 2016-02-13 ENCOUNTER — Ambulatory Visit (INDEPENDENT_AMBULATORY_CARE_PROVIDER_SITE_OTHER): Payer: BLUE CROSS/BLUE SHIELD | Admitting: Neurology

## 2016-02-13 ENCOUNTER — Encounter: Payer: Self-pay | Admitting: Neurology

## 2016-02-13 VITALS — BP 110/80 | HR 68 | Resp 16 | Ht <= 58 in | Wt 105.5 lb

## 2016-02-13 DIAGNOSIS — M5431 Sciatica, right side: Secondary | ICD-10-CM | POA: Diagnosis not present

## 2016-02-13 DIAGNOSIS — F988 Other specified behavioral and emotional disorders with onset usually occurring in childhood and adolescence: Secondary | ICD-10-CM | POA: Diagnosis not present

## 2016-02-13 DIAGNOSIS — M5432 Sciatica, left side: Secondary | ICD-10-CM | POA: Diagnosis not present

## 2016-02-13 DIAGNOSIS — G43009 Migraine without aura, not intractable, without status migrainosus: Secondary | ICD-10-CM

## 2016-02-13 DIAGNOSIS — G47411 Narcolepsy with cataplexy: Secondary | ICD-10-CM | POA: Diagnosis not present

## 2016-02-13 DIAGNOSIS — G40B09 Juvenile myoclonic epilepsy, not intractable, without status epilepticus: Secondary | ICD-10-CM

## 2016-02-13 MED ORDER — AMPHETAMINE-DEXTROAMPHETAMINE 30 MG PO TABS: 30.0000 mg | ORAL_TABLET | Freq: Three times a day (TID) | ORAL | 0 refills | Status: DC

## 2016-02-13 NOTE — Progress Notes (Signed)
[facility]  PATIENT: Abigail Lowery DOB: 28-Mar-1985  REFERRING DOCTOR OR PCP:  Adaku Nnodi SOURCE: patient  _________________________________   HISTORICAL  CHIEF COMPLAINT:  Chief Complaint  Patient presents with  . JME    Denies sz. activity and sts. she is compliant with Lamictal.  Sts. Narcolepsy ok with Vivactil and Adderall.  Sts. lbp is some worse, radiating backs and sides of both legs, all the way down to toes.  Left worse than right.  She would like tpi's today if appropriate/fim  . Narcolepsy  . Migraines  . Sciatica    HISTORY OF PRESENT ILLNESS:  Abigail Lowery is a 30 year old woman with juvenile myoclonic epilepsy , narcolepsy, sciatica and miigraine headaches and sciatica.          JME:    She is stable on current med's and has not had any staring spells since 2015-2016.   No GTC since 2008-2009.      She is doing well on lamotrigine and tolerates it well.   She has not had any more morning myoclonus on current dose of lamotrigine.     Seizure history:    She was diagnosed with JME around age 30. 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 Similar staring spells occurred and she had an EEG with a pattern consistent with juvenile myoclonic epilepsy. In the omornings, she would often have mild clonus. She still has mild clonus at times in the morning. At age 30, she had a single convulsive seizure. Initially, she was placed on Depakote but due to some breakthrough staring spells and poor tolerability, she was switched to Lamictal.   Narcolepsy/ADD:    She is on Adderall 30 mg and takes 3 times a day with benefit (sometimes skips third dose). Protriptyline has helped the cataplexy and the sleep paralysis.   She has had only one episode this year and none since her last visit. She occasionally takes a nap.   On weekdays, she sleeps 10 hours and sleeps 12 hours on weekends .    Narcolepsy History:  She was diagnosed with narcolepsy in  2008 after presenting with severe sleepiness. She had PSG and MSLT in 2008 showing severe hypersomnia and sleep onset REM consistent with narcolepsy. She also had mild OSA on that initial sleep study.   A repeat PSG 06/28/2012 showed no significant OSA (AHI equals 0.2). She did have some snoring. She also had moderate periodic limb movements of sleep with an index of 25.3 and an arousal index of 11.9.. She was placed on stimulants and is currently on Adderall 30 mg 3 times a day. She tolerates this dose well. She feels her hypersomnia does well once she is awake.   She sets 3 alarms because she has trouble getting up many mornings. She gets very rare episodes of cataplexy.   Sleep paralysis events have been helped by protriptyline.[name]. She will have hypnagogic hallucinations at times.  Sciatica/Back pain:   She has bilateral buttock and leg pain.  The worse pain is in the buttock and it will sometime radiate into the legs, left > right.  Pain is worse with prolonged sitting.    She receives benefit from piriformis trigger point injections, she gets about 3 shots/year with at least 3 months benefit each time.  They usually start to wear off between 3 and 4 months.      MRI of the lumbar spine around 2013 was normal.  Migraine Headache:  Theses are unchanged.  On a tpical month, she will get 3-4 migraine headaches (lasting 1-3 days), often associated with her period (at least one of the migraine).    She will have photophobia and phonophobia and nausea (no vomiting) with the headaches. Moving will make the headaches worse. Usually she takes Ibuprofin with sumatriptan with benefit.     Other:   She notes some swelling in her amrs and numbness at times   REVIEW OF SYSTEMS: Constitutional: No fevers, chills, sweats, or change in appetite Eyes: No visual changes, double vision, eye pain Ear, nose and throat: No hearing loss, ear pain, nasal congestion, sore throat Cardiovascular: No chest pain,  palpitations Respiratory: No shortness of breath at rest or with exertion.   No wheezes GastrointestinaI: No nausea, vomiting, diarrhea, abdominal pain, fecal incontinence Genitourinary: No dysuria, urinary retention or frequency.  No nocturia. Musculoskeletal: No neck pain.   Back pain as above Integumentary: No rash, pruritus, skin lesions Neurological: as above Psychiatric: No depression at this time.  No anxiety Endocrine: No palpitations, diaphoresis, change in appetite, change in weigh or increased thirst Hematologic/Lymphatic: No anemia, purpura, petechiae. Allergic/Immunologic: No itchy/runny eyes, nasal congestion, recent allergic reactions, rashes  ALLERGIES: Allergies  Allergen Reactions  . Dust Mite Extract Itching  . Mold Extract [Trichophyton] Itching    HOME MEDICATIONS:  Current Outpatient Prescriptions:  .  amphetamine-dextroamphetamine (ADDERALL) 30 MG tablet, Take 1 tablet by mouth 3 (three) times daily., Disp: 90 tablet, Rfl: 0 .  FLUoxetine (PROZAC) 40 MG capsule, Take 40 mg by mouth daily., Disp: , Rfl:  .  lamoTRIgine (LAMICTAL) 200 MG tablet, TAKE 1 TABLET BY MOUTH TWICE A DAY., Disp: 180 tablet, Rfl: 1 .  LORazepam (ATIVAN) 0.5 MG tablet, Take 0.5 mg by mouth every 8 (eight) hours as needed for anxiety., Disp: , Rfl:  .  promethazine (PHENERGAN) 25 MG tablet, Take 1 tablet (25 mg total) by mouth every 6 (six) hours as needed for nausea., Disp: 20 tablet, Rfl: 0 .  protriptyline (VIVACTIL) 10 MG tablet, Take 1 tablet (10 mg total) by mouth at bedtime., Disp: 30 tablet, Rfl: 11 .  SUMAtriptan (IMITREX) 100 MG tablet, Take 1 tablet (100 mg total) by mouth once as needed for migraine. May repeat in 2 hours if headache persists or recurs., Disp: 10 tablet, Rfl: 11  PAST MEDICAL HISTORY: Past Medical History:  Diagnosis Date  . Anxiety   . Colitis 2003  . Depression   . Epilepsy (HCC)   . Headache(784.0)   . Hyperlipidemia   . Migraines   . Narcolepsy    . Narcolepsy   . Seizures (HCC)    Petit mal    PAST SURGICAL HISTORY: Past Surgical History:  Procedure Laterality Date  . ADENOIDECTOMY  1998  . BILATERAL HIP ARTHROSCOPY  2011, 2012  . WRIST SURGERY Right 2004    FAMILY HISTORY: Family History  Problem Relation Age of Onset  . Depression Mother   . Anxiety disorder Mother   . Depression Father   . Anxiety disorder Maternal Aunt   . Depression Maternal Aunt   . Bipolar disorder Maternal Uncle   . Schizophrenia Maternal Grandfather   . Bipolar disorder Cousin     SOCIAL HISTORY:  Social History   Social History  . Marital status: Single    Spouse name: N/A  . Number of children: N/A  . Years of education: N/A   Occupational History  . Not on file.  Social History Main Topics  . Smoking status: Never Smoker  . Smokeless tobacco: Never Used  . Alcohol use 0.6 oz/week    1 Glasses of wine per week     Comment: Rare  . Drug use: No  . Sexual activity: No   Other Topics Concern  . Not on file   Social History Narrative  . No narrative on file     PHYSICAL EXAM  Vitals:   02/13/16 1055  BP: 110/80  Pulse: 68  Resp: 16    General: The patient is well-developed and well-nourished and in no acute distress   Musculoskeletal:   She is tender over piriformis muscles, worse on the left. No lumbar paraspinal tenderness. No tenderness over trochanteric bursae. Range of motion of the back appeared normal.   Mild occipital tenderness on the right.    Neurologic Exam  Mental status: The patient is alert and oriented x 3 at the time of the examination. The patient has apparent normal recent and remote memory, with an apparently normal attention span and concentration ability.   Speech is normal.  Cranial nerves: Extraocular movements are full.  Facial symmetry is present. There is good facial sensation to soft touch bilaterally.Facial strength is normal.  Trapezius and sternocleidomastoid strength is normal.  No dysarthria is noted.    No obvious hearing deficits are noted.  Motor:  Muscle bulk is normal.   Tone is normal. Strength is  5 / 5 in all 4 extremities.   Sensory: Sensory testing show mild decreased touch over the hypothenar eminences and the left L5 distribution.  Coordination: Cerebellar testing reveals good finger-nose-finger and heel-to-shin bilaterally.  Gait and station: Station is normal.   Gait is normal. Tandem gait is normal.    Reflexes: Deep tendon reflexes are symmetric and normal bilaterally.     Other:   Mild Tinel's sign at left elbow (to digits 4/5).      DIAGNOSTIC DATA (LABS, IMAGING, TESTING) - I reviewed patient records, labs, notes, testing and imaging myself where available.  Lab Results  Component Value Date   WBC 10.3 05/03/2013   HGB 12.3 05/03/2013   HCT 36.5 05/03/2013   MCV 91.7 05/03/2013   PLT 200 05/03/2013      Component Value Date/Time   NA 137 05/03/2013 1358   K 4.4 05/03/2013 1358   CL 96 05/03/2013 1358   CO2 28 05/03/2013 1358   GLUCOSE 111 (H) 05/03/2013 1358   BUN 11 05/03/2013 1358   CREATININE 0.69 05/03/2013 1358   CALCIUM 9.8 05/03/2013 1358   PROT 7.3 05/03/2013 1358   ALBUMIN 4.1 05/03/2013 1358   AST 21 05/03/2013 1358   ALT 13 05/03/2013 1358   ALKPHOS 82 05/03/2013 1358   BILITOT 0.6 05/03/2013 1358   GFRNONAA >90 05/03/2013 1358   GFRAA >90 05/03/2013 1358       ASSESSMENT AND PLAN  Nonintractable juvenile myoclonic epilepsy without status epilepticus (HCC)  Bilateral sciatica  Narcolepsy and cataplexy  Attention deficit disorder, unspecified hyperactivity presence  Migraine without aura and without status migrainosus, not intractable   1.   Trigger point inject bilateral piriformis muscles with 80 mg Depo-Medrol in 5 mL Marcaine. She tolerated the procedure well and there were no complications. 2.  Renew Adderall 30 mg po tid for narcolepsy.   Continue protriptyline for cataplexy and sleep  paralysis 3.   Continue lamotrigine at current dose for JME.    4.   If hand numbness worsens, consider  NCv/EMG 5.   Return to clinic in 4 months or sooner if she has new or worsening neurologic symptoms.   Quinterrius Errington A. Epimenio Foot, MD, PhD 02/13/2016, 11:28 AM Certified in Neurology, Clinical Neurophysiology, Sleep Medicine, Pain Medicine and Neuroimaging  [facility] [address] [place], [place] [address] [phone]

## 2016-03-18 ENCOUNTER — Telehealth: Payer: Self-pay | Admitting: Neurology

## 2016-03-18 NOTE — Telephone Encounter (Signed)
Patient requesting refill of amphetamine-dextroamphetamine (ADDERALL) 30 MG tablet ° ° °

## 2016-03-19 MED ORDER — AMPHETAMINE-DEXTROAMPHETAMINE 30 MG PO TABS: 30.0000 mg | ORAL_TABLET | Freq: Three times a day (TID) | ORAL | 0 refills | Status: DC

## 2016-03-19 NOTE — Telephone Encounter (Signed)
Rx. awaiting RAS sig/fim 

## 2016-03-19 NOTE — Telephone Encounter (Signed)
Adderall rx. up front [facility]/fim 

## 2016-03-19 NOTE — Addendum Note (Signed)
Addended by: Candis SchatzMISENHEIMER, Timberly Yott I on: 03/19/2016 08:26 AM   Modules accepted: Orders

## 2016-04-30 ENCOUNTER — Telehealth: Payer: Self-pay | Admitting: Neurology

## 2016-04-30 NOTE — Telephone Encounter (Signed)
Patient called office requesting refill for amphetamine-dextroamphetamine (ADDERALL) 30 MG tablet.

## 2016-05-01 MED ORDER — AMPHETAMINE-DEXTROAMPHETAMINE 30 MG PO TABS: 30.0000 mg | ORAL_TABLET | Freq: Three times a day (TID) | ORAL | 0 refills | Status: DC

## 2016-05-01 NOTE — Addendum Note (Signed)
Addended by: Candis SchatzMISENHEIMER, Lorree Millar I on: 05/01/2016 08:17 AM   Modules accepted: Orders

## 2016-05-01 NOTE — Telephone Encounter (Signed)
Rx. up front GNA/fim 

## 2016-05-01 NOTE — Telephone Encounter (Signed)
Rx. awaiting RAS sig/fim 

## 2016-05-28 ENCOUNTER — Other Ambulatory Visit: Payer: Self-pay | Admitting: Neurology

## 2016-06-10 ENCOUNTER — Telehealth: Payer: Self-pay | Admitting: Neurology

## 2016-06-10 MED ORDER — AMPHETAMINE-DEXTROAMPHETAMINE 30 MG PO TABS: 30.0000 mg | ORAL_TABLET | Freq: Three times a day (TID) | ORAL | 0 refills | Status: DC

## 2016-06-10 NOTE — Telephone Encounter (Signed)
Patient requesting refill of amphetamine-dextroamphetamine (ADDERALL) 30 MG tablet ° ° °

## 2016-06-10 NOTE — Telephone Encounter (Signed)
Rs. awaiting RAS sig/fim

## 2016-06-10 NOTE — Addendum Note (Signed)
Addended by: Candis Schatz I on: 06/10/2016 04:18 PM   Modules accepted: Orders

## 2016-06-11 NOTE — Telephone Encounter (Signed)
Rx. up front GNA/fim 

## 2016-06-19 ENCOUNTER — Ambulatory Visit (INDEPENDENT_AMBULATORY_CARE_PROVIDER_SITE_OTHER): Payer: BLUE CROSS/BLUE SHIELD | Admitting: Neurology

## 2016-06-19 ENCOUNTER — Encounter: Payer: Self-pay | Admitting: Neurology

## 2016-06-19 VITALS — BP 97/63 | HR 85 | Ht <= 58 in | Wt 110.0 lb

## 2016-06-19 DIAGNOSIS — G40B09 Juvenile myoclonic epilepsy, not intractable, without status epilepticus: Secondary | ICD-10-CM | POA: Diagnosis not present

## 2016-06-19 DIAGNOSIS — M5431 Sciatica, right side: Secondary | ICD-10-CM

## 2016-06-19 DIAGNOSIS — M5432 Sciatica, left side: Secondary | ICD-10-CM

## 2016-06-19 DIAGNOSIS — G47411 Narcolepsy with cataplexy: Secondary | ICD-10-CM

## 2016-06-19 DIAGNOSIS — G43009 Migraine without aura, not intractable, without status migrainosus: Secondary | ICD-10-CM | POA: Diagnosis not present

## 2016-06-19 MED ORDER — AMPHETAMINE-DEXTROAMPHETAMINE 30 MG PO TABS: 30.0000 mg | ORAL_TABLET | Freq: Three times a day (TID) | ORAL | 0 refills | Status: DC

## 2016-06-19 NOTE — Progress Notes (Signed)
[facility]  PATIENT: Abigail Lowery DOB: 1985-06-27  REFERRING DOCTOR OR PCP:  Adaku Nnodi SOURCE: patient  _________________________________   HISTORICAL  CHIEF COMPLAINT:  Chief Complaint  Patient presents with  . Seizures    Denies new sz. activity and sts. is compliant with meds.  Sts. h/a's are same frequency, same severity, and Imitrex helps.  Narcolepsy doing well with current meds. Would like tpi for back pain that radiates down backs and sides of both legs to toes.Abigail Lowery  . Narcolepsy  . Migraines  . Back Pain    HISTORY OF PRESENT ILLNESS:  Abigail Lowery is a 31 year old woman with juvenile myoclonic epilepsy , narcolepsy, sciatica and miigraine headaches and sciatica.          JME:    She is stable on current med's and has not had any staring spells since 2015-2016.   No GTC since 2008-2009.      She is doing well on lamotrigine and tolerates it well.   She has not had any more morning myoclonus on current dose of lamotrigine.     Seizure history:    She was diagnosed with JME around age 80. 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 Similar staring spells occurred and she had an EEG with a pattern consistent with juvenile myoclonic epilepsy. In the omornings, she would often have mild clonus. She still has mild clonus at times in the morning. At age 66, she had a single convulsive seizure. Initially, she was placed on Depakote but due to some breakthrough staring spells and poor tolerability, she was switched to Lamictal.   Narcolepsy/ADD:    She ha generally done very well on Adderall though is mildly more sleepy this week (just started Jordan).   She is on Adderall 30 mg and takes 3 times a day with benefit (sometimes skips third dose). Protriptyline has helped the cataplexy and the sleep paralysis.   She has had only one episode this year and none since her last visit. She only rarely takes a nap.   On weekdays, she sleeps only 6 hours  some weekday and sleeps 12 hours on weekends .    Narcolepsy History:  She was diagnosed with narcolepsy in 2008 after presenting with severe sleepiness. She had PSG and MSLT in 2008 showing severe hypersomnia and sleep onset REM consistent with narcolepsy. She also had mild OSA on that initial sleep study.   A repeat PSG 06/28/2012 showed no significant OSA (AHI equals 0.2). She did have some snoring. She also had moderate periodic limb movements of sleep with an index of 25.3 and an arousal index of 11.9.. She was placed on stimulants and is currently on Adderall 30 mg 3 times a day. She tolerates this dose well. She feels her hypersomnia does well once she is awake.   She sets 3 alarms because she has trouble getting up many mornings. She gets very rare episodes of cataplexy.   Sleep paralysis events have been helped by protriptyline.Abigail Lowery She will have hypnagogic hallucinations at times.  Sciatica/Back pain:   Over the past month, her lower back and buttock pain has increased,   Pain will sometimes go to her toes, left > right.  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     She receives benefit from piriformis trigger point injections, she gets about 3 shots/year with at least 3 months benefit each time.  They  usually start to wear off between 3 and 4 months.      MRI of the lumbar spine around 2013 was normal.        Migraine Headache:  She has migraines about 3-4 days a month, often associated with her period (at least one of the migraine).  A few months ago, she had more.     She will have photophobia and phonophobia and nausea (no vomiting) with the headaches. Moving will make the headaches worse. Usually she takes Ibuprofin with sumatriptan with benefit.     Other:   Her Psychiatrist just started titrating Latuda up to 40 mg daily.     REVIEW OF SYSTEMS: Constitutional: No fevers, chills, sweats, or change in appetite Eyes: No  visual changes, double vision, eye pain Ear, nose and throat: No hearing loss, ear pain, nasal congestion, sore throat Cardiovascular: No chest pain, palpitations Respiratory: No shortness of breath at rest or with exertion.   No wheezes GastrointestinaI: No nausea, vomiting, diarrhea, abdominal pain, fecal incontinence Genitourinary: No dysuria, urinary retention or frequency.  No nocturia. Musculoskeletal: No neck pain.   Back pain as above Integumentary: No rash, pruritus, skin lesions Neurological: as above Psychiatric: No depression at this time.  No anxiety Endocrine: No palpitations, diaphoresis, change in appetite, change in weigh or increased thirst Hematologic/Lymphatic: No anemia, purpura, petechiae. Allergic/Immunologic: No itchy/runny eyes, nasal congestion, recent allergic reactions, rashes  ALLERGIES: Allergies  Allergen Reactions  . Dust Mite Extract Itching  . Mold Extract [Trichophyton] Itching    HOME MEDICATIONS:  Current Outpatient Prescriptions:  .  amphetamine-dextroamphetamine (ADDERALL) 30 MG tablet, Take 1 tablet by mouth 3 (three) times daily. Fill on or after 07/09/16, Disp: 90 tablet, Rfl: 0 .  lamoTRIgine (LAMICTAL) 200 MG tablet, TAKE 1 TABLET BY MOUTH TWICE A DAY., Disp: 180 tablet, Rfl: 2 .  LORazepam (ATIVAN) 0.5 MG tablet, Take 0.5 mg by mouth every 8 (eight) hours as needed for anxiety., Disp: , Rfl:  .  promethazine (PHENERGAN) 25 MG tablet, Take 1 tablet (25 mg total) by mouth every 6 (six) hours as needed for nausea., Disp: 20 tablet, Rfl: 0 .  protriptyline (VIVACTIL) 10 MG tablet, Take 1 tablet (10 mg total) by mouth at bedtime., Disp: 30 tablet, Rfl: 11 .  SUMAtriptan (IMITREX) 100 MG tablet, Take 1 tablet (100 mg total) by mouth once as needed for migraine. Lowery repeat in 2 hours if headache persists or recurs., Disp: 10 tablet, Rfl: 11 .  FLUoxetine (PROZAC) 40 MG capsule, Take 40 mg by mouth daily., Disp: , Rfl:   PAST MEDICAL  HISTORY: Past Medical History:  Diagnosis Date  . Anxiety   . Colitis 2003  . Depression   . Epilepsy (HCC)   . Headache(784.0)   . Hyperlipidemia   . Migraines   . Narcolepsy   . Narcolepsy   . Seizures (HCC)    Petit mal    PAST SURGICAL HISTORY: Past Surgical History:  Procedure Laterality Date  . ADENOIDECTOMY  1998  . BILATERAL HIP ARTHROSCOPY  2011, 2012  . WRIST SURGERY Right 2004    FAMILY HISTORY: Family History  Problem Relation Age of Onset  . Depression Mother   . Anxiety disorder Mother   . Depression Father   . Anxiety disorder Maternal Aunt   . Depression Maternal Aunt   . Bipolar disorder Maternal Uncle   . Schizophrenia Maternal Grandfather   . Bipolar disorder Cousin     SOCIAL HISTORY:  Social History  Social History  . Marital status: Single    Spouse name: N/A  . Number of children: N/A  . Years of education: N/A   Occupational History  . Not on file.   Social History Main Topics  . Smoking status: Never Smoker  . Smokeless tobacco: Never Used  . Alcohol use 0.6 oz/week    1 Glasses of wine per week     Comment: Rare  . Drug use: No  . Sexual activity: No   Other Topics Concern  . Not on file   Social History Narrative  . No narrative on file     PHYSICAL EXAM  Vitals:   06/19/16 1128  BP: 97/63  Pulse: 85    General: The patient is well-developed and well-nourished and in no acute distress   Musculoskeletal:   She has tenderness over the piriformis muscles bilaterally, left greater than right. There was no lumbar para spinal tenderness and no significant tenderness over the trochanteric bursae.   Neurologic Exam  Mental status: The patient is alert and oriented x 3 at the time of the examination. The patient has apparent normal recent and remote memory, with an apparently normal attention span and concentration ability.   Speech is normal.  Cranial nerves: Extraocular movements are full.  Facial symmetry is  present. There is good facial sensation to soft touch bilaterally.Facial strength is normal.  Trapezius and sternocleidomastoid strength is normal. No dysarthria is noted.    No obvious hearing deficits are noted.  Motor:  Muscle bulk is normal.   Tone is normal. Strength is  5 / 5 in all 4 extremities.   Sensory: Sensory testing show mild decreased touch over the hypothenar eminences and the left L5 distribution.  Coordination: Cerebellar testing reveals good finger-nose-finger and heel-to-shin bilaterally.  Gait and station: Station is normal.   Gait is normal. Tandem gait is normal.    Reflexes: Deep tendon reflexes are symmetric and normal bilaterally.        DIAGNOSTIC DATA (LABS, IMAGING, TESTING) - I reviewed patient records, labs, notes, testing and imaging myself where available.  Lab Results  Component Value Date   WBC 10.3 05/03/2013   HGB 12.3 05/03/2013   HCT 36.5 05/03/2013   MCV 91.7 05/03/2013   PLT 200 05/03/2013      Component Value Date/Time   NA 137 05/03/2013 1358   K 4.4 05/03/2013 1358   CL 96 05/03/2013 1358   CO2 28 05/03/2013 1358   GLUCOSE 111 (H) 05/03/2013 1358   BUN 11 05/03/2013 1358   CREATININE 0.69 05/03/2013 1358   CALCIUM 9.8 05/03/2013 1358   PROT 7.3 05/03/2013 1358   ALBUMIN 4.1 05/03/2013 1358   AST 21 05/03/2013 1358   ALT 13 05/03/2013 1358   ALKPHOS 82 05/03/2013 1358   BILITOT 0.6 05/03/2013 1358   GFRNONAA >90 05/03/2013 1358   GFRAA >90 05/03/2013 1358       ASSESSMENT AND PLAN  Nonintractable juvenile myoclonic epilepsy without status epilepticus (HCC)  Narcolepsy and cataplexy  Migraine without aura and without status migrainosus, not intractable  Bilateral sciatica   1.   Trigger point injection into both piriformis muscles with a total of 80 mg Depo-Medrol in 5 mL Marcaine.. She tolerated the procedure well and there were no complications. 2.  Adderall 30 mg by mouth 3 times a day for narcolepsy. New  prescription was provided..   Continue protriptyline for cataplexy and sleep paralysis 3.   Continue lamotrigine at current  dose for JME.    4.   Return to clinic in 4 months or sooner if she has new or worsening neurologic symptoms.   Teala Daffron A. Epimenio Foot, MD, PhD 06/19/2016, 1:09 PM Certified in Neurology, Clinical Neurophysiology, Sleep Medicine, Pain Medicine and Neuroimaging  [facility] [address], [address] [place], [place] [address] [phone]

## 2016-09-03 ENCOUNTER — Telehealth: Payer: Self-pay | Admitting: Neurology

## 2016-09-03 MED ORDER — AMPHETAMINE-DEXTROAMPHETAMINE 30 MG PO TABS: 30.0000 mg | ORAL_TABLET | Freq: Three times a day (TID) | ORAL | 0 refills | Status: DC

## 2016-09-03 NOTE — Telephone Encounter (Signed)
Patient requesting refill of  amphetamine-dextroamphetamine (ADDERALL) 30 MG tablet. Patient going out of town and would like 2 Rx's, 1 for this month and 1 post dated for next month.

## 2016-09-03 NOTE — Addendum Note (Signed)
Addended by: Candis SchatzMISENHEIMER, Milana Salay I on: 09/03/2016 01:17 PM   Modules accepted: Orders

## 2016-09-03 NOTE — Telephone Encounter (Signed)
Rx's awaiting RAS sig/fim 

## 2016-09-03 NOTE — Telephone Encounter (Signed)
Rx's up front [facility]/fim 

## 2016-09-09 ENCOUNTER — Telehealth: Payer: Self-pay | Admitting: *Deleted

## 2016-09-09 NOTE — Telephone Encounter (Signed)
PA for Adderall 30mg  tid [facility] [place] fax# [phone]/fim

## 2016-09-10 NOTE — Telephone Encounter (Signed)
[place]  Adderall 30mg  #90/30 approved for dates 09/09/16 thru 09/08/19. Ref# [id number]/fim

## 2016-10-30 ENCOUNTER — Ambulatory Visit: Payer: BLUE CROSS/BLUE SHIELD | Admitting: Neurology

## 2016-11-13 ENCOUNTER — Telehealth: Payer: Self-pay | Admitting: Neurology

## 2016-11-13 MED ORDER — AMPHETAMINE-DEXTROAMPHETAMINE 30 MG PO TABS: 30.0000 mg | ORAL_TABLET | Freq: Three times a day (TID) | ORAL | 0 refills | Status: DC

## 2016-11-13 NOTE — Addendum Note (Signed)
Addended by: Candis SchatzMISENHEIMER, Reagyn Facemire I on: 11/13/2016 11:07 AM   Modules accepted: Orders

## 2016-11-13 NOTE — Telephone Encounter (Signed)
Pt calling for a refill of amphetamine-dextroamphetamine (ADDERALL) 30 MG tablet

## 2016-11-13 NOTE — Telephone Encounter (Signed)
Rx. awaiting RAS sig/fim 

## 2016-11-13 NOTE — Telephone Encounter (Signed)
Rx. up front GNA/fim 

## 2016-12-24 ENCOUNTER — Other Ambulatory Visit: Payer: Self-pay | Admitting: Neurology

## 2016-12-26 ENCOUNTER — Encounter: Payer: Self-pay | Admitting: Neurology

## 2016-12-26 ENCOUNTER — Ambulatory Visit (INDEPENDENT_AMBULATORY_CARE_PROVIDER_SITE_OTHER): Payer: BLUE CROSS/BLUE SHIELD | Admitting: Neurology

## 2016-12-26 VITALS — BP 101/65 | HR 90 | Resp 14 | Ht <= 58 in | Wt 110.0 lb

## 2016-12-26 DIAGNOSIS — G47411 Narcolepsy with cataplexy: Secondary | ICD-10-CM | POA: Diagnosis not present

## 2016-12-26 DIAGNOSIS — M5431 Sciatica, right side: Secondary | ICD-10-CM

## 2016-12-26 DIAGNOSIS — G43009 Migraine without aura, not intractable, without status migrainosus: Secondary | ICD-10-CM | POA: Diagnosis not present

## 2016-12-26 DIAGNOSIS — M5432 Sciatica, left side: Secondary | ICD-10-CM

## 2016-12-26 DIAGNOSIS — G40B09 Juvenile myoclonic epilepsy, not intractable, without status epilepticus: Secondary | ICD-10-CM

## 2016-12-26 MED ORDER — AMPHETAMINE-DEXTROAMPHETAMINE 30 MG PO TABS: 30.0000 mg | ORAL_TABLET | Freq: Three times a day (TID) | ORAL | 0 refills | Status: DC

## 2016-12-26 MED ORDER — SUMATRIPTAN SUCCINATE 100 MG PO TABS: ORAL_TABLET | ORAL | 3 refills | Status: DC

## 2016-12-26 NOTE — Progress Notes (Signed)
[facility]  PATIENT: Abigail Lowery DOB: 01/24/1986  REFERRING DOCTOR OR PCP:  Adaku Nnodi SOURCE: patient  _________________________________   HISTORICAL  CHIEF COMPLAINT:  Chief Complaint  Patient presents with  . Narcolepsy with Cataplexy    Denies sz. activity since last ov. Sts. Narcolepsy ok with current med regimen. Requeests piriformis inj. for back pain today if appropriate/fim  . Seizures  . Migraines  . Back Pain    HISTORY OF PRESENT ILLNESS:  Abigail Lowery is a 31 year old woman with juvenile myoclonic epilepsy , narcolepsy, sciatica and miigraine headaches and sciatica.          JME:    She denies any recent seizures. She is on lamotrigine and tolerates it well. She is stable on current med's and has not had any staring spells since 2015-2016.   No GTC since 2008-2009.       She has not had any more morning myoclonus on current dose of lamotrigine.     Seizure history:    She was diagnosed with JME around age 57. 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 Similar staring spells occurred and she had an EEG with a pattern consistent with juvenile myoclonic epilepsy. In the omornings, she would often have mild clonus. She still has mild clonus at times in the morning. At age 33, she had a single convulsive seizure. Initially, she was placed on Depakote but due to some breakthrough staring spells and poor tolerability, she was switched to Lamictal.   Narcolepsy/ADD:    She notes sleepiness but feels that Adderall has controlled it for the most part. She takes Adderall for 30 mg 3 times a day though she will sometimes skips the third dose if she does not need to be focused and awake later in the day. Since starting protriptyline, she has not had any cataplexy and sleep paralysis is very rare, only once a year now . She rarely takes a nap.   On weekdays, she sleeps only 6 hours some weekday and sleeps 12 hours on weekends .    Narcolepsy  History:  She was diagnosed with narcolepsy in 2008 after presenting with severe sleepiness. She had PSG and MSLT in 2008 showing severe hypersomnia and sleep onset REM consistent with narcolepsy. She also had mild OSA on that initial sleep study.   A repeat PSG 06/28/2012 showed no significant OSA (AHI equals 0.2). She did have some snoring. She also had moderate periodic limb movements of sleep with an index of 25.3 and an arousal index of 11.9.. She was placed on stimulants and is currently on Adderall 30 mg 3 times a day. She tolerates this dose well. She feels her hypersomnia does well once she is awake.   She sets 3 alarms because she has trouble getting up many mornings. She gets very rare episodes of cataplexy.   Sleep paralysis events have been helped by protriptyline.[name] She will have hypnagogic hallucinations at times.  Sciatica/Back pain:   She received 3 or 4 months of benefit from the last piriformis trigger point injection but her pain has now returned. Present it is in the lower back and buttocks and occasionally will radiate into the toes on the left. She is very active and some movements or exercises will increase the pain.       MRI of the lumbar spine around 2013 was normal.        Migraine Headache:  She experiences headaches about 3 or 4  days a month, usually around the time of her period. When she gets a headache she will have pounding pain, photophobia, phonophobia and nausea but no vomiting. Moving makes the headache worse. Ibuprofen combined with sumatriptan has helped her the most.   Mood:   The combination of Prozac and Latuda has helped her Biopolar 2 and depression.         REVIEW OF SYSTEMS: Constitutional: No fevers, chills, sweats, or change in appetite Eyes: No visual changes, double vision, eye pain Ear, nose and throat: No hearing loss, ear pain, nasal congestion, sore throat Cardiovascular: No chest pain, palpitations Respiratory: No shortness of breath at rest or  with exertion.   No wheezes GastrointestinaI: No nausea, vomiting, diarrhea, abdominal pain, fecal incontinence Genitourinary: No dysuria, urinary retention or frequency.  No nocturia. Musculoskeletal: No neck pain.   Back pain as above Integumentary: No rash, pruritus, skin lesions Neurological: as above Psychiatric: No depression at this time.  No anxiety Endocrine: No palpitations, diaphoresis, change in appetite, change in weigh or increased thirst Hematologic/Lymphatic: No anemia, purpura, petechiae. Allergic/Immunologic: No itchy/runny eyes, nasal congestion, recent allergic reactions, rashes  ALLERGIES: Allergies  Allergen Reactions  . Dust Mite Extract Itching  . Mold Extract [Trichophyton] Itching    HOME MEDICATIONS:  Current Outpatient Prescriptions:  .  amphetamine-dextroamphetamine (ADDERALL) 30 MG tablet, Take 1 tablet by mouth 3 (three) times daily. Fill on or after 02/24/2017, Disp: 90 tablet, Rfl: 0 .  FLUoxetine (PROZAC) 40 MG capsule, Take 40 mg by mouth daily., Disp: , Rfl:  .  lamoTRIgine (LAMICTAL) 200 MG tablet, TAKE 1 TABLET BY MOUTH TWICE A DAY., Disp: 180 tablet, Rfl: 2 .  LORazepam (ATIVAN) 0.5 MG tablet, Take 0.5 mg by mouth every 8 (eight) hours as needed for anxiety., Disp: , Rfl:  .  lurasidone (LATUDA) 20 MG TABS tablet, Take 20 mg by mouth at bedtime., Disp: , Rfl:  .  promethazine (PHENERGAN) 25 MG tablet, Take 1 tablet (25 mg total) by mouth every 6 (six) hours as needed for nausea., Disp: 20 tablet, Rfl: 0 .  protriptyline (VIVACTIL) 10 MG tablet, Take 1 tablet (10 mg total) by mouth at bedtime., Disp: 30 tablet, Rfl: 11 .  SUMAtriptan (IMITREX) 100 MG tablet, Take one po prn headache.   May repeat in 2 hours if headache persists or recurs., Disp: 30 tablet, Rfl: 3  PAST MEDICAL HISTORY: Past Medical History:  Diagnosis Date  . Anxiety   . Colitis 2003  . Depression   . Epilepsy (HCC)   . Headache(784.0)   . Hyperlipidemia   . Migraines     . Narcolepsy   . Narcolepsy   . Seizures (HCC)    Petit mal    PAST SURGICAL HISTORY: Past Surgical History:  Procedure Laterality Date  . ADENOIDECTOMY  1998  . BILATERAL HIP ARTHROSCOPY  2011, 2012  . WRIST SURGERY Right 2004    FAMILY HISTORY: Family History  Problem Relation Age of Onset  . Depression Mother   . Anxiety disorder Mother   . Depression Father   . Anxiety disorder Maternal Aunt   . Depression Maternal Aunt   . Bipolar disorder Maternal Uncle   . Schizophrenia Maternal Grandfather   . Bipolar disorder Cousin     SOCIAL HISTORY:  Social History   Social History  . Marital status: Single    Spouse name: N/A  . Number of children: N/A  . Years of education: N/A   Occupational History  .  Not on file.   Social History Main Topics  . Smoking status: Never Smoker  . Smokeless tobacco: Never Used  . Alcohol use 0.6 oz/week    1 Glasses of wine per week     Comment: Rare  . Drug use: No  . Sexual activity: No   Other Topics Concern  . Not on file   Social History Narrative  . No narrative on file     PHYSICAL EXAM  Vitals:   12/26/16 1148  BP: 101/65  Pulse: 90  Resp: 14    General: The patient is well-developed and well-nourished and in no acute distress   Musculoskeletal:   There is tenderness over the piriformis muscles bilaterally. There is no significant tenderness over the lower lumbar paraspinal muscles or the trochanteric bursae.[name].   Neurologic Exam  Mental status: The patient is alert and oriented x 3 at the time of the examination. The patient has apparent normal recent and remote memory, with an apparently normal attention span and concentration ability.   Speech is normal.  Cranial nerves: Extraocular movements are full. Facial strength and sensation is normal. Trapezius strength is normal. Hearing is normal.   Motor:  Muscle bulk is normal.   Tone is normal. Strength is  5 / 5 in all 4 extremities.   Sensory: Sensory  testing show mildly asymmetric sensation to touch in the L5 distribution but otherwise normal touch and vibration.  Coordination: Cerebellar testing reveals good finger-nose-finger and heel-to-shin bilaterally.  Gait and station: Station is normal.   Gait and tandem walk normal.  Reflexes: Deep tendon reflexes are symmetric and normal bilaterally.        DIAGNOSTIC DATA (LABS, IMAGING, TESTING) - I reviewed patient records, labs, notes, testing and imaging myself where available.  Lab Results  Component Value Date   WBC 10.3 05/03/2013   HGB 12.3 05/03/2013   HCT 36.5 05/03/2013   MCV 91.7 05/03/2013   PLT 200 05/03/2013      Component Value Date/Time   NA 137 05/03/2013 1358   K 4.4 05/03/2013 1358   CL 96 05/03/2013 1358   CO2 28 05/03/2013 1358   GLUCOSE 111 (H) 05/03/2013 1358   BUN 11 05/03/2013 1358   CREATININE 0.69 05/03/2013 1358   CALCIUM 9.8 05/03/2013 1358   PROT 7.3 05/03/2013 1358   ALBUMIN 4.1 05/03/2013 1358   AST 21 05/03/2013 1358   ALT 13 05/03/2013 1358   ALKPHOS 82 05/03/2013 1358   BILITOT 0.6 05/03/2013 1358   GFRNONAA >90 05/03/2013 1358   GFRAA >90 05/03/2013 1358       ASSESSMENT AND PLAN  Narcolepsy and cataplexy  Nonintractable juvenile myoclonic epilepsy without status epilepticus (HCC)  Bilateral sciatica  Migraine without aura and without status migrainosus, not intractable   1.   T bilateral piriformis muscle trigger point injection with a total of 80 mg Depo-Medrol in 5 mL Marcaine. She tolerated the injections well and there were no complications.   2.  Continue Adderall  30 mg by mouth 3 times a day for narcolepsy. New prescription was provided..   Continue protriptyline for cataplexy and sleep paralysis 3.   He will continue the current dose of lamotrigine for her JM E. When necessary Imitrex for headaches. 4.   Return to clinic in 6 months or sooner if she has new or worsening neurologic symptoms.   Elius Etheredge A. Epimenio FootSater,  MD, PhD 12/26/2016, 12:15 PM Certified in Neurology, Clinical Neurophysiology, Sleep Medicine, Pain Medicine and  Neuroimaging  [facility] [address] [place], [place] [address] [phone]

## 2017-05-15 ENCOUNTER — Other Ambulatory Visit: Payer: Self-pay | Admitting: Neurology

## 2017-05-15 ENCOUNTER — Telehealth: Payer: Self-pay | Admitting: Neurology

## 2017-05-15 MED ORDER — AMPHETAMINE-DEXTROAMPHETAMINE 30 MG PO TABS: 30.0000 mg | ORAL_TABLET | Freq: Three times a day (TID) | ORAL | 0 refills | Status: DC

## 2017-05-15 NOTE — Telephone Encounter (Signed)
Rx. awaiting RAS sig/fim 

## 2017-05-15 NOTE — Telephone Encounter (Signed)
Spoke with Joni ReiningNicole.  [place] [facility]  Will see if [facility] can send her rx. there/fim

## 2017-05-15 NOTE — Telephone Encounter (Signed)
Pt request refill for amphetamine-dextroamphetamine (ADDERALL) 30 MG tablet. Pt's mother will pick up RX on Monday.

## 2017-06-26 ENCOUNTER — Ambulatory Visit: Payer: BLUE CROSS/BLUE SHIELD | Admitting: Neurology

## 2017-06-26 ENCOUNTER — Encounter: Payer: Self-pay | Admitting: Neurology

## 2017-06-26 VITALS — BP 100/67 | HR 75 | Ht <= 58 in | Wt 109.0 lb

## 2017-06-26 DIAGNOSIS — M5431 Sciatica, right side: Secondary | ICD-10-CM | POA: Diagnosis not present

## 2017-06-26 DIAGNOSIS — G43009 Migraine without aura, not intractable, without status migrainosus: Secondary | ICD-10-CM

## 2017-06-26 DIAGNOSIS — F329 Major depressive disorder, single episode, unspecified: Secondary | ICD-10-CM | POA: Diagnosis not present

## 2017-06-26 DIAGNOSIS — F32A Depression, unspecified: Secondary | ICD-10-CM

## 2017-06-26 DIAGNOSIS — G40B09 Juvenile myoclonic epilepsy, not intractable, without status epilepticus: Secondary | ICD-10-CM | POA: Diagnosis not present

## 2017-06-26 DIAGNOSIS — F988 Other specified behavioral and emotional disorders with onset usually occurring in childhood and adolescence: Secondary | ICD-10-CM

## 2017-06-26 DIAGNOSIS — M5432 Sciatica, left side: Secondary | ICD-10-CM | POA: Diagnosis not present

## 2017-06-26 MED ORDER — LAMOTRIGINE 200 MG PO TABS: 200.0000 mg | ORAL_TABLET | Freq: Two times a day (BID) | ORAL | 4 refills | Status: DC

## 2017-06-26 MED ORDER — SUMATRIPTAN SUCCINATE 100 MG PO TABS
ORAL_TABLET | ORAL | 3 refills | Status: DC
Start: 2017-06-26 — End: 2018-06-15

## 2017-06-26 MED ORDER — AMPHETAMINE-DEXTROAMPHETAMINE 30 MG PO TABS: 30.0000 mg | ORAL_TABLET | Freq: Three times a day (TID) | ORAL | 0 refills | Status: DC

## 2017-06-26 MED ORDER — PROTRIPTYLINE HCL 10 MG PO TABS: 10.0000 mg | ORAL_TABLET | Freq: Every day | ORAL | 4 refills | Status: DC

## 2017-06-26 MED ORDER — PROMETHAZINE HCL 25 MG PO TABS: 25.0000 mg | ORAL_TABLET | Freq: Four times a day (QID) | ORAL | 1 refills | Status: AC | PRN

## 2017-06-26 NOTE — Progress Notes (Signed)
[facility]  PATIENT: Abigail Lowery DOB: [birth year]  REFERRING DOCTOR OR PCP:  Adaku Nnodi SOURCE: patient  _________________________________   HISTORICAL  CHIEF COMPLAINT:  Chief Complaint  Patient presents with  . Follow-up    Patient reports that she is doing well on her current medication.     HISTORY OF PRESENT ILLNESS:  Abigail Lowery is a 32 year old woman with juvenile myoclonic epilepsy , narcolepsy, sciatica and miigraine headaches and sciatica.     Update 06/26/2017: Seizures are doing well.   No GTC and she is not aware of any staring spells,  She feels the morning myoclonus is minimal now.   She continues on lamotrigine and tolerates it well.    Her narcolepsy seems well controlled on Adderall.    She sleeps well at night.   She has had rare cataplexy, more likely to occur if she misses an Adderall dose.  The addition of protriptyline greatly helped the number of cataplexy episodes.  She still occasionally gets unusual dreams, sometimes of being attacked.   No recent sleep paralysis.     Her sciatica was better for a few months after the last piriformis muscle injections.  Over the last 2 months, however, pain is returning.  Migraines continue to do well and she averages just 1 a month helped by sumatriptan.  If she has nausea she will take the Phenergan.       From 12/26/2016: JME:    She denies any recent seizures. She is on lamotrigine and tolerates it well. She is stable on current med's and has not had any staring spells since 2015-2016.   No GTC since 2008-2009.       She has not had any more morning myoclonus on current dose of lamotrigine.     Seizure history:    She was diagnosed with JME around age 52. 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 Similar staring spells occurred and she had an EEG with a pattern consistent with juvenile myoclonic epilepsy. In the omornings, she would often have mild clonus. She still has mild  clonus at times in the morning. At age 24, she had a single convulsive seizure. Initially, she was placed on Depakote but due to some breakthrough staring spells and poor tolerability, she was switched to Lamictal.   Narcolepsy/ADD:    She notes sleepiness but feels that Adderall has controlled it for the most part. She takes Adderall for 30 mg 3 times a day though she will sometimes skips the third dose if she does not need to be focused and awake later in the day. Since starting protriptyline, she has not had any cataplexy and sleep paralysis is very rare, only once a year now . She rarely takes a nap.   On weekdays, she sleeps only 6 hours some weekday and sleeps 12 hours on weekends .    Narcolepsy History:  She was diagnosed with narcolepsy in 2008 after presenting with severe sleepiness. She had PSG and MSLT in 2008 showing severe hypersomnia and sleep onset REM consistent with narcolepsy. She also had mild OSA on that initial sleep study.   A repeat PSG 06/28/2012 showed no significant OSA (AHI equals 0.2). She did have some snoring. She also had moderate periodic limb movements of sleep with an index of 25.3 and an arousal index of 11.9.. She was placed on stimulants and is currently on Adderall 30 mg 3 times a day. She tolerates this dose well. She  feels her hypersomnia does well once she is awake.   She sets 3 alarms because she has trouble getting up many mornings. She gets very rare episodes of cataplexy.   Sleep paralysis events have been helped by protriptyline.[name]. She will have hypnagogic hallucinations at times.  Sciatica/Back pain:   She received 3 or 4 months of benefit from the last piriformis trigger point injection but her pain has now returned. Present it is in the lower back and buttocks and occasionally will radiate into the toes on the left. She is very active and some movements or exercises will increase the pain.       MRI of the lumbar spine around 2013 was normal.        Migraine  Headache:  She experiences headaches about 3 or 4 days a month, usually around the time of her period. When she gets a headache she will have pounding pain, photophobia, phonophobia and nausea but no vomiting. Moving makes the headache worse. Ibuprofen combined with sumatriptan has helped her the most.   Mood:   The combination of Prozac and Latuda has helped her Biopolar 2 and depression.         REVIEW OF SYSTEMS: Constitutional: No fevers, chills, sweats, or change in appetite Eyes: No visual changes, double vision, eye pain Ear, nose and throat: No hearing loss, ear pain, nasal congestion, sore throat Cardiovascular: No chest pain, palpitations Respiratory: No shortness of breath at rest or with exertion.   No wheezes GastrointestinaI: No nausea, vomiting, diarrhea, abdominal pain, fecal incontinence Genitourinary: No dysuria, urinary retention or frequency.  No nocturia. Musculoskeletal: No neck pain.   Back pain as above Integumentary: No rash, pruritus, skin lesions Neurological: as above Psychiatric: No depression at this time.  No anxiety Endocrine: No palpitations, diaphoresis, change in appetite, change in weigh or increased thirst Hematologic/Lymphatic: No anemia, purpura, petechiae. Allergic/Immunologic: No itchy/runny eyes, nasal congestion, recent allergic reactions, rashes  ALLERGIES: Allergies  Allergen Reactions  . Dust Mite Extract Itching  . Mold Extract [Trichophyton] Itching    HOME MEDICATIONS:  Current Outpatient Medications:  .  amphetamine-dextroamphetamine (ADDERALL) 30 MG tablet, Take 1 tablet by mouth 3 (three) times daily. Fill on or after 06/27/2017, Disp: 90 tablet, Rfl: 0 .  FLUoxetine (PROZAC) 40 MG capsule, Take 40 mg by mouth daily., Disp: , Rfl:  .  lamoTRIgine (LAMICTAL) 200 MG tablet, Take 1 tablet (200 mg total) by mouth 2 (two) times daily., Disp: 180 tablet, Rfl: 4 .  LORazepam (ATIVAN) 0.5 MG tablet, Take 0.5 mg by mouth every 8 (eight)  hours as needed for anxiety., Disp: , Rfl:  .  lurasidone (LATUDA) 20 MG TABS tablet, Take 20 mg by mouth at bedtime., Disp: , Rfl:  .  promethazine (PHENERGAN) 25 MG tablet, Take 1 tablet (25 mg total) by mouth every 6 (six) hours as needed for nausea., Disp: 30 tablet, Rfl: 1 .  protriptyline (VIVACTIL) 10 MG tablet, Take 1 tablet (10 mg total) by mouth at bedtime., Disp: 90 tablet, Rfl: 4 .  SUMAtriptan (IMITREX) 100 MG tablet, Take one po prn headache.   May repeat in 2 hours if headache persists or recurs., Disp: 30 tablet, Rfl: 3  PAST MEDICAL HISTORY: Past Medical History:  Diagnosis Date  . Anxiety   . Colitis 2003  . Depression   . Epilepsy (HCC)   . Headache(784.0)   . Hyperlipidemia   . Migraines   . Narcolepsy   . Narcolepsy   . Seizures (  HCC)    Petit mal    PAST SURGICAL HISTORY: Past Surgical History:  Procedure Laterality Date  . ADENOIDECTOMY  1998  . BILATERAL HIP ARTHROSCOPY  2011, 2012  . WRIST SURGERY Right 2004    FAMILY HISTORY: Family History  Problem Relation Age of Onset  . Depression Mother   . Anxiety disorder Mother   . Depression Father   . Anxiety disorder Maternal Aunt   . Depression Maternal Aunt   . Bipolar disorder Maternal Uncle   . Schizophrenia Maternal Grandfather   . Bipolar disorder Cousin     SOCIAL HISTORY:  Social History   Socioeconomic History  . Marital status: Single    Spouse name: Not on file  . Number of children: Not on file  . Years of education: Not on file  . Highest education level: Not on file  Occupational History  . Not on file  Social Needs  . Financial resource strain: Not on file  . Food insecurity:    Worry: Not on file    Inability: Not on file  . Transportation needs:    Medical: Not on file    Non-medical: Not on file  Tobacco Use  . Smoking status: Never Smoker  . Smokeless tobacco: Never Used  Substance and Sexual Activity  . Alcohol use: Yes    Alcohol/week: 0.6 oz    Types: 1  Glasses of wine per week    Comment: Rare  . Drug use: No  . Sexual activity: Never  Lifestyle  . Physical activity:    Days per week: Not on file    Minutes per session: Not on file  . Stress: Not on file  Relationships  . Social connections:    Talks on phone: Not on file    Gets together: Not on file    Attends religious service: Not on file    Active member of club or organization: Not on file    Attends meetings of clubs or organizations: Not on file    Relationship status: Not on file  . Intimate partner violence:    Fear of current or ex partner: Not on file    Emotionally abused: Not on file    Physically abused: Not on file    Forced sexual activity: Not on file  Other Topics Concern  . Not on file  Social History Narrative  . Not on file     PHYSICAL EXAM  Vitals:   06/26/17 1124  BP: 100/67  Pulse: 75    General: The patient is well-developed and well-nourished and in no acute distress   Musculoskeletal:   There is tenderness over the piriformis muscles bilaterally. There is no significant tenderness over the lower lumbar paraspinal muscles or the trochanteric bursae.[name]   Neurologic Exam  Mental status: The patient is alert and oriented x 3 at the time of the examination. The patient has apparent normal recent and remote memory, with an apparently normal attention span and concentration ability.   Speech is normal.  Cranial nerves: Extraocular movements are full.  Facial strength and sensation is normal.  The trapezius strength is normal.  Her hearing is symmetric.  Motor:  Muscle bulk is normal.  Muscle tone and muscle strength are normal.  Sensory:   She had intact sensation to touch in the arms and legs.  Coordination: Cerebellar testing reveals good finger-nose-finger and heel-to-shin bilaterally.  Gait and station: Station is normal.  Gait is normal.  Tandem walk is normal.  Reflexes: Deep tendon reflexes are symmetric and normal bilaterally.         DIAGNOSTIC DATA (LABS, IMAGING, TESTING) - I reviewed patient records, labs, notes, testing and imaging myself where available.  Lab Results  Component Value Date   WBC 10.3 05/03/2013   HGB 12.3 05/03/2013   HCT 36.5 05/03/2013   MCV 91.7 05/03/2013   PLT 200 05/03/2013      Component Value Date/Time   NA 137 05/03/2013 1358   K 4.4 05/03/2013 1358   CL 96 05/03/2013 1358   CO2 28 05/03/2013 1358   GLUCOSE 111 (H) 05/03/2013 1358   BUN 11 05/03/2013 1358   CREATININE 0.69 05/03/2013 1358   CALCIUM 9.8 05/03/2013 1358   PROT 7.3 05/03/2013 1358   ALBUMIN 4.1 05/03/2013 1358   AST 21 05/03/2013 1358   ALT 13 05/03/2013 1358   ALKPHOS 82 05/03/2013 1358   BILITOT 0.6 05/03/2013 1358   GFRNONAA >90 05/03/2013 1358   GFRAA >90 05/03/2013 1358       ASSESSMENT AND PLAN  Nonintractable juvenile myoclonic epilepsy without status epilepticus (HCC)  Bilateral sciatica  Migraine without aura and without status migrainosus, not intractable  Attention deficit disorder, unspecified hyperactivity presence  Depression, unspecified depression type   1.   Bilateral piriformis muscle trigger point injections with 80 mg Depo-Medrol and 5 cc Marcaine.  There were no complications and she tolerated the procedure well.     2.  She will continue Adderall 30 mg 3 times a day for narcolepsy and ADD.. New prescription was provided..   Continue protriptyline for cataplexy and sleep paralysis 3.   Abigail Lowery has been well controlled on lamotrigine 200 mg twice daily.  She will also take Imitrex for headaches. 4.   Return to clinic in 6 months or sooner if she has new or worsening neurologic symptoms.   Richard A. Epimenio Foot, MD, PhD 06/26/2017, 12:02 PM Certified in Neurology, Clinical Neurophysiology, Sleep Medicine, Pain Medicine and Neuroimaging  [facility] [address], [address] [place], [place] [address] [phone]

## 2017-08-19 ENCOUNTER — Other Ambulatory Visit: Payer: Self-pay | Admitting: Neurology

## 2017-08-19 MED ORDER — AMPHETAMINE-DEXTROAMPHETAMINE 30 MG PO TABS: 30.0000 mg | ORAL_TABLET | Freq: Three times a day (TID) | ORAL | 0 refills | Status: DC

## 2017-08-19 NOTE — Telephone Encounter (Signed)
Pt requesting refills for amphetamine-dextroamphetamine (ADDERALL) 30 MG tablet sent to [facility]  °

## 2017-10-06 ENCOUNTER — Other Ambulatory Visit: Payer: Self-pay | Admitting: Neurology

## 2017-10-06 MED ORDER — AMPHETAMINE-DEXTROAMPHETAMINE 30 MG PO TABS: 30.0000 mg | ORAL_TABLET | Freq: Three times a day (TID) | ORAL | 0 refills | Status: DC

## 2017-10-06 NOTE — Telephone Encounter (Signed)
Pt request refill foramphetamine-dextroamphetamine (ADDERALL) 30 MG tablet [id number] - [place] [place] - [address] [facility] [address]

## 2017-10-23 ENCOUNTER — Other Ambulatory Visit: Payer: Self-pay | Admitting: Neurology

## 2017-11-19 ENCOUNTER — Other Ambulatory Visit: Payer: Self-pay | Admitting: Neurology

## 2017-11-19 MED ORDER — AMPHETAMINE-DEXTROAMPHETAMINE 30 MG PO TABS: 30.0000 mg | ORAL_TABLET | Freq: Three times a day (TID) | ORAL | 0 refills | Status: DC

## 2017-11-19 NOTE — Telephone Encounter (Signed)
Pt requesting refills for amphetamine-dextroamphetamine (ADDERALL) 30 MG tablet [facility]

## 2017-11-19 NOTE — Telephone Encounter (Signed)
Pt last seen 06/26/17 and next f/u 12/26/17

## 2017-12-24 ENCOUNTER — Telehealth: Payer: Self-pay | Admitting: *Deleted

## 2017-12-24 NOTE — Telephone Encounter (Signed)
Called, LVM for pt asking if she can come at 1030am for appt instead of 11:30am on Friday with Dr. Epimenio Foot. Gave [facility] phone number for her to call office.

## 2017-12-25 NOTE — Telephone Encounter (Signed)
Katie T. Called patient this am. Pt agreed to move appt up to 1030am. She r/s pt.

## 2017-12-26 ENCOUNTER — Ambulatory Visit: Payer: Self-pay | Admitting: Neurology

## 2017-12-26 ENCOUNTER — Encounter: Payer: Self-pay | Admitting: Neurology

## 2017-12-26 ENCOUNTER — Other Ambulatory Visit: Payer: Self-pay

## 2017-12-26 ENCOUNTER — Ambulatory Visit: Payer: BLUE CROSS/BLUE SHIELD | Admitting: Neurology

## 2017-12-26 VITALS — BP 97/64 | HR 72 | Resp 14 | Ht <= 58 in | Wt 97.5 lb

## 2017-12-26 DIAGNOSIS — M5432 Sciatica, left side: Secondary | ICD-10-CM

## 2017-12-26 DIAGNOSIS — G40B09 Juvenile myoclonic epilepsy, not intractable, without status epilepticus: Secondary | ICD-10-CM

## 2017-12-26 DIAGNOSIS — G47411 Narcolepsy with cataplexy: Secondary | ICD-10-CM

## 2017-12-26 DIAGNOSIS — M5431 Sciatica, right side: Secondary | ICD-10-CM

## 2017-12-26 MED ORDER — AMPHETAMINE-DEXTROAMPHETAMINE 30 MG PO TABS: 30.0000 mg | ORAL_TABLET | Freq: Three times a day (TID) | ORAL | 0 refills | Status: DC

## 2017-12-26 NOTE — Progress Notes (Signed)
[facility]  PATIENT: Abigail Lowery DOB: 04-09-1985  REFERRING DOCTOR OR PCP:  Adaku Nnodi SOURCE: patient  _________________________________   HISTORICAL  CHIEF COMPLAINT:  Chief Complaint  Patient presents with  . Narcolepsy    Sts. she is doing well with current med regimen.  Requesting piriformis inj. today if appropriate/fim  . Seizures  . Back Pain    HISTORY OF PRESENT ILLNESS:  Abigail Lowery is a 32 y.o. woman with juvenile myoclonic epilepsy , narcolepsy, sciatica and miigraine headaches and sciatica.     Update 12/26/2017: She is doing well.    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 She is on lamotrigine and tolerates it well.  Her narcolepsy is doing very well on Adderall.   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   She has no difficulty with sleepiness while driving (just drove from [place]).    She still has hypnic hallucinations (though less than in the past) but no recent sleep paralysis.   Her LBP and buttock pain is mild to moderate now.   The TPIs have helped a lot to reduce the pain and she gets a benefit for many months.    She denies weakness in her legs but sometimes gets radiating apin and numbness.     Update 06/26/2017: Seizures are doing well.   No GTC and she is not aware of any staring spells,  She feels the morning myoclonus is minimal now.   She continues on lamotrigine and tolerates it well.    Her narcolepsy seems well controlled on Adderall.    She sleeps well at night.   She has had rare cataplexy, more likely to occur if she misses an Adderall dose.  The addition of protriptyline greatly helped the number of cataplexy episodes.  She still occasionally gets unusual dreams, sometimes of being attacked.   No recent sleep paralysis.     Her sciatica was better for a few months after the last piriformis muscle  injections.  Over the last 2 months, however, pain is returning.  Migraines continue to do well and she averages just 1 a month helped by sumatriptan.  If she has nausea she will take the Phenergan.       From 12/26/2016: JME:    She denies any recent seizures. She is on lamotrigine and tolerates it well. She is stable on current med's and has not had any staring spells since 2015-2016.   No GTC since 2008-2009.       She has not had any more morning myoclonus on current dose of lamotrigine.     Seizure history:    She was diagnosed with JME around age 62. 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 Similar staring spells occurred and she had an EEG with a pattern consistent with juvenile myoclonic epilepsy. In the omornings, she would often have mild clonus. She still has mild clonus at times in the morning. At age 69, she had a single convulsive seizure. Initially, she was placed on Depakote but due to some breakthrough staring spells and poor tolerability, she was switched to Lamictal.   Narcolepsy/ADD:    She notes sleepiness but feels that Adderall has controlled it for the most part. She takes Adderall for 30 mg 3 times a day though she will sometimes skips the third dose  if she does not need to be focused and awake later in the day. Since starting protriptyline, she has not had any cataplexy and sleep paralysis is very rare, only once a year now . She rarely takes a nap.   On weekdays, she sleeps only 6 hours some weekday and sleeps 12 hours on weekends .    Narcolepsy History:  She was diagnosed with narcolepsy in 2008 after presenting with severe sleepiness. She had PSG and MSLT in 2008 showing severe hypersomnia and sleep onset REM consistent with narcolepsy. She also had mild OSA on that initial sleep study.   A repeat PSG 06/28/2012 showed no significant OSA (AHI equals 0.2). She did have some snoring. She also had moderate periodic limb movements of sleep with an index of 25.3 and  an arousal index of 11.9.. She was placed on stimulants and is currently on Adderall 30 mg 3 times a day. She tolerates this dose well. She feels her hypersomnia does well once she is awake.   She sets 3 alarms because she has trouble getting up many mornings. She gets very rare episodes of cataplexy.   Sleep paralysis events have been helped by protriptyline.[name] She will have hypnagogic hallucinations at times.  Sciatica/Back pain:   She received 3 or 4 months of benefit from the last piriformis trigger point injection but her pain has now returned. Present it is in the lower back and buttocks and occasionally will radiate into the toes on the left. She is very active and some movements or exercises will increase the pain.       MRI of the lumbar spine around 2013 was normal.        Migraine Headache:  She experiences headaches about 3 or 4 days a month, usually around the time of her period. When she gets a headache she will have pounding pain, photophobia, phonophobia and nausea but no vomiting. Moving makes the headache worse. Ibuprofen combined with sumatriptan has helped her the most.   Mood:   The combination of Prozac and Latuda has helped her Biopolar 2 and depression.         REVIEW OF SYSTEMS: Constitutional: No fevers, chills, sweats, or change in appetite Eyes: No visual changes, double vision, eye pain Ear, nose and throat: No hearing loss, ear pain, nasal congestion, sore throat Cardiovascular: No chest pain, palpitations Respiratory: No shortness of breath at rest or with exertion.   No wheezes GastrointestinaI: No nausea, vomiting, diarrhea, abdominal pain, fecal incontinence Genitourinary: No dysuria, urinary retention or frequency.  No nocturia. Musculoskeletal: No neck pain.   Back pain as above Integumentary: No rash, pruritus, skin lesions Neurological: as above Psychiatric: No depression at this time.  No anxiety Endocrine: No palpitations, diaphoresis, change in appetite,  change in weigh or increased thirst Hematologic/Lymphatic: No anemia, purpura, petechiae. Allergic/Immunologic: No itchy/runny eyes, nasal congestion, recent allergic reactions, rashes  ALLERGIES: Allergies  Allergen Reactions  . Dust Mite Extract Itching  . Mold Extract [Trichophyton] Itching    HOME MEDICATIONS:  Current Outpatient Medications:  .  amphetamine-dextroamphetamine (ADDERALL) 30 MG tablet, Take 1 tablet by mouth 3 (three) times daily., Disp: 90 tablet, Rfl: 0 .  FLUoxetine (PROZAC) 40 MG capsule, Take 40 mg by mouth daily., Disp: , Rfl:  .  lamoTRIgine (LAMICTAL) 200 MG tablet, Take 1 tablet (200 mg total) by mouth 2 (two) times daily., Disp: 180 tablet, Rfl: 4 .  LORazepam (ATIVAN) 0.5 MG tablet, Take 0.5 mg by mouth every 8 (  eight) hours as needed for anxiety., Disp: , Rfl:  .  promethazine (PHENERGAN) 25 MG tablet, Take 1 tablet (25 mg total) by mouth every 6 (six) hours as needed for nausea., Disp: 30 tablet, Rfl: 1 .  protriptyline (VIVACTIL) 10 MG tablet, Take 1 tablet (10 mg total) by mouth at bedtime., Disp: 90 tablet, Rfl: 4 .  SUMAtriptan (IMITREX) 100 MG tablet, Take one po prn headache.   May repeat in 2 hours if headache persists or recurs., Disp: 30 tablet, Rfl: 3  PAST MEDICAL HISTORY: Past Medical History:  Diagnosis Date  . Anxiety   . Colitis 2003  . Depression   . Epilepsy (HCC)   . Headache(784.0)   . Hyperlipidemia   . Migraines   . Narcolepsy   . Narcolepsy   . Seizures (HCC)    Petit mal    PAST SURGICAL HISTORY: Past Surgical History:  Procedure Laterality Date  . ADENOIDECTOMY  1998  . BILATERAL HIP ARTHROSCOPY  2011, 2012  . WRIST SURGERY Right 2004    FAMILY HISTORY: Family History  Problem Relation Age of Onset  . Depression Mother   . Anxiety disorder Mother   . Depression Father   . Anxiety disorder Maternal Aunt   . Depression Maternal Aunt   . Bipolar disorder Maternal Uncle   . Schizophrenia Maternal Grandfather     . Bipolar disorder Cousin     SOCIAL HISTORY:  Social History   Socioeconomic History  . Marital status: Single    Spouse name: Not on file  . Number of children: Not on file  . Years of education: Not on file  . Highest education level: Not on file  Occupational History  . Not on file  Social Needs  . Financial resource strain: Not on file  . Food insecurity:    Worry: Not on file    Inability: Not on file  . Transportation needs:    Medical: Not on file    Non-medical: Not on file  Tobacco Use  . Smoking status: Never Smoker  . Smokeless tobacco: Never Used  Substance and Sexual Activity  . Alcohol use: Yes    Alcohol/week: 1.0 standard drinks    Types: 1 Glasses of wine per week    Comment: Rare  . Drug use: No  . Sexual activity: Never  Lifestyle  . Physical activity:    Days per week: Not on file    Minutes per session: Not on file  . Stress: Not on file  Relationships  . Social connections:    Talks on phone: Not on file    Gets together: Not on file    Attends religious service: Not on file    Active member of club or organization: Not on file    Attends meetings of clubs or organizations: Not on file    Relationship status: Not on file  . Intimate partner violence:    Fear of current or ex partner: Not on file    Emotionally abused: Not on file    Physically abused: Not on file    Forced sexual activity: Not on file  Other Topics Concern  . Not on file  Social History Narrative  . Not on file     PHYSICAL EXAM  Vitals:   12/26/17 1050  BP: 97/64  Pulse: 72  Resp: 14    General: The patient is well-developed and well-nourished and in no acute distress   Musculoskeletal:   She has moderate tenderness  over the piriformis muscles bilaterally.  There is minimal tenderness over the trochanteric bursae and no tenderness over the lower lumbar paraspinal muscles.  Range of motion is normal in the back. Neurologic Exam  Mental status: The  patient is alert and oriented x 3 at the time of the examination. The patient has apparent normal recent and remote memory, with an apparently normal attention span and concentration ability.   Speech is normal.  Cranial nerves: Extraocular movements are full.  Facial strength and sensation is normal.  The trapezius strength is normal.  Her hearing is symmetric.  Motor:  Muscle bulk is normal.  Muscle tone and muscle strength are normal.  Sensory:   She had intact sensation to touch in the arms and legs.  Coordination: Cerebellar testing reveals good finger-nose-finger and heel-to-shin bilaterally.  Gait and station: Station is normal.  Gait and tandem gait are normal.  Reflexes: Deep tendon reflexes are symmetric and normal bilaterally.        DIAGNOSTIC DATA (LABS, IMAGING, TESTING) - I reviewed patient records, labs, notes, testing and imaging myself where available.  Lab Results  Component Value Date   WBC 10.3 05/03/2013   HGB 12.3 05/03/2013   HCT 36.5 05/03/2013   MCV 91.7 05/03/2013   PLT 200 05/03/2013      Component Value Date/Time   NA 137 05/03/2013 1358   K 4.4 05/03/2013 1358   CL 96 05/03/2013 1358   CO2 28 05/03/2013 1358   GLUCOSE 111 (H) 05/03/2013 1358   BUN 11 05/03/2013 1358   CREATININE 0.69 05/03/2013 1358   CALCIUM 9.8 05/03/2013 1358   PROT 7.3 05/03/2013 1358   ALBUMIN 4.1 05/03/2013 1358   AST 21 05/03/2013 1358   ALT 13 05/03/2013 1358   ALKPHOS 82 05/03/2013 1358   BILITOT 0.6 05/03/2013 1358   GFRNONAA >90 05/03/2013 1358   GFRAA >90 05/03/2013 1358       ASSESSMENT AND PLAN  No diagnosis found.   1.  Bilateral piriformis muscle trigger point injections with 80 mg Depo-Medrol and 5 cc Marcaine using sterile technique.  There were no complications and she tolerated the procedure well.     2.  She will continue Adderall 30 mg 3 times a day for narcolepsy and ADD.   Continue protriptyline for cataplexy and sleep paralysis.   3.   The  juvenile myoclonic epilepsy is well controlled on lamotrigine 200 mg twice daily.  She will also take Imitrex for headaches. 4.   Return to clinic in 6 months or sooner if she has new or worsening neurologic symptoms.   Richard A. Epimenio Foot, MD, PhD 12/26/2017, 11:41 AM Certified in Neurology, Clinical Neurophysiology, Sleep Medicine, Pain Medicine and Neuroimaging  [facility] [address], [address] [place], [place] [address] [phone]

## 2018-02-13 ENCOUNTER — Other Ambulatory Visit: Payer: Self-pay | Admitting: Neurology

## 2018-02-13 MED ORDER — AMPHETAMINE-DEXTROAMPHETAMINE 30 MG PO TABS: 30.0000 mg | ORAL_TABLET | Freq: Three times a day (TID) | ORAL | 0 refills | Status: DC

## 2018-02-13 NOTE — Telephone Encounter (Signed)
No issues on noted in [place] narcotic registry.  Rx due.

## 2018-02-13 NOTE — Telephone Encounter (Signed)
Pt requesting refills for amphetamine-dextroamphetamine (ADDERALL) 30 MG tablet sent to [facility]  °

## 2018-04-01 ENCOUNTER — Other Ambulatory Visit: Payer: Self-pay | Admitting: Neurology

## 2018-04-01 MED ORDER — AMPHETAMINE-DEXTROAMPHETAMINE 30 MG PO TABS: 30.0000 mg | ORAL_TABLET | Freq: Three times a day (TID) | ORAL | 0 refills | Status: DC

## 2018-04-01 NOTE — Addendum Note (Signed)
Addended by: Brooke Dare, Rockie Schnoor L on: 04/01/2018 05:00 PM   Modules accepted: Orders

## 2018-04-01 NOTE — Telephone Encounter (Signed)
I called [facility] on file to see when pt last refilled prescription since pt filling in [place]. Unable to pull up on drug registry. Spoke with Maralyn Sago. She last refilled rx on 02/14/18 #90/30days supply. She was last seen 12/26/17 and next f/u 06/15/18.

## 2018-04-01 NOTE — Telephone Encounter (Signed)
Pt has called for a refill on her amphetamine-dextroamphetamine (ADDERALL) 30 MG tablet [id number] [place] [place] *no changes to insurance*

## 2018-05-19 ENCOUNTER — Other Ambulatory Visit: Payer: Self-pay | Admitting: Neurology

## 2018-05-19 MED ORDER — AMPHETAMINE-DEXTROAMPHETAMINE 30 MG PO TABS: 30.0000 mg | ORAL_TABLET | Freq: Three times a day (TID) | ORAL | 0 refills | Status: DC

## 2018-05-19 NOTE — Telephone Encounter (Signed)
Pr request refill for amphetamine-dextroamphetamine (ADDERALL) 30 MG tablet sent to [facility]

## 2018-05-25 ENCOUNTER — Telehealth: Payer: Self-pay | Admitting: Neurology

## 2018-05-25 NOTE — Telephone Encounter (Signed)
I called, LVM for pt relaying Dr. Bonnita Hollow suggestion. Asked she call back and let us know if ok to switch OV to video visit. Gave [facility] phone number

## 2018-05-25 NOTE — Telephone Encounter (Signed)
It would be best to turn it into a video visit as we will likely still be restricted in April

## 2018-05-25 NOTE — Telephone Encounter (Signed)
Patient called in to see if it was ok for her to travel to her appt in April from [place]

## 2018-05-26 NOTE — Telephone Encounter (Signed)
Called, LVM for pt to call office. 

## 2018-06-10 ENCOUNTER — Telehealth: Payer: Self-pay | Admitting: Neurology

## 2018-06-10 NOTE — Telephone Encounter (Signed)
Called pt. Updated medication list/allergies/pharmacy on file for her upcoming virtual visit on 06/15/18.

## 2018-06-10 NOTE — Telephone Encounter (Signed)
I spoke to the patient and offered her to do a virtual visit with Dr. Epimenio Foot. She was wondering if she can reschedule this appointment for a later day or does she need to do the virtual visit right now. She stated one of the only reason she comes in the office is to get a trigger point and she stated she can't get that right now since she cant come in the office. Please advise.

## 2018-06-10 NOTE — Telephone Encounter (Signed)
Abigail Lowery- she will need to keep appt and do virtual visit with Dr. Epimenio Foot. We prescribe controlled medications for her and in order to continue prescribing, he will need to do the visit since this is her 6 month f/u. Can you get this set up? Thank you!

## 2018-06-10 NOTE — Telephone Encounter (Signed)
I spoke to the patient and informed her due to the medicine that Dr. Epimenio Foot prescribe her he would like her to still have a virtual visit. She stated she will do a virtual visit. I did go over all the details with her. I also told her if she has not received and email from me in the next 30 mins to give me a call.

## 2018-06-15 ENCOUNTER — Ambulatory Visit (INDEPENDENT_AMBULATORY_CARE_PROVIDER_SITE_OTHER): Payer: Self-pay | Admitting: Neurology

## 2018-06-15 ENCOUNTER — Other Ambulatory Visit: Payer: Self-pay

## 2018-06-15 ENCOUNTER — Encounter: Payer: Self-pay | Admitting: Neurology

## 2018-06-15 DIAGNOSIS — G47411 Narcolepsy with cataplexy: Secondary | ICD-10-CM

## 2018-06-15 DIAGNOSIS — M5431 Sciatica, right side: Secondary | ICD-10-CM

## 2018-06-15 DIAGNOSIS — F988 Other specified behavioral and emotional disorders with onset usually occurring in childhood and adolescence: Secondary | ICD-10-CM

## 2018-06-15 DIAGNOSIS — G40B09 Juvenile myoclonic epilepsy, not intractable, without status epilepticus: Secondary | ICD-10-CM

## 2018-06-15 DIAGNOSIS — M5432 Sciatica, left side: Secondary | ICD-10-CM

## 2018-06-15 MED ORDER — LORAZEPAM 0.5 MG PO TABS: 0.5000 mg | ORAL_TABLET | Freq: Three times a day (TID) | ORAL | 0 refills | Status: DC | PRN

## 2018-06-15 MED ORDER — SUMATRIPTAN SUCCINATE 100 MG PO TABS: ORAL_TABLET | ORAL | 3 refills | Status: DC

## 2018-06-15 MED ORDER — LAMOTRIGINE 200 MG PO TABS: 200.0000 mg | ORAL_TABLET | Freq: Two times a day (BID) | ORAL | 4 refills | Status: DC

## 2018-06-15 MED ORDER — FLUOXETINE HCL 40 MG PO CAPS: 40.0000 mg | ORAL_CAPSULE | Freq: Every day | ORAL | 4 refills | Status: DC

## 2018-06-15 MED ORDER — PROTRIPTYLINE HCL 10 MG PO TABS: 10.0000 mg | ORAL_TABLET | Freq: Every day | ORAL | 4 refills | Status: DC

## 2018-06-15 MED ORDER — AMPHETAMINE-DEXTROAMPHETAMINE 30 MG PO TABS: 30.0000 mg | ORAL_TABLET | Freq: Three times a day (TID) | ORAL | 0 refills | Status: DC

## 2018-06-15 NOTE — Progress Notes (Signed)
[facility]  PATIENT: Abigail Lowery DOB: 03/05/1985  REFERRING DOCTOR OR PCP:  Adaku Nnodi SOURCE: patient  _________________________________   HISTORICAL  CHIEF COMPLAINT:  Chief Complaint  Patient presents with  . Seizures    JME  . Other    HISTORY OF PRESENT ILLNESS:  Abigail Lowery is a 33 y.o. woman with juvenile myoclonic epilepsy , narcolepsy, sciatica and miigraine headaches and sciatica.     Update 06/15/2018: Virtual Visit via Video Note  I connected with Abigail Lowery on 06/15/18 at 11:00 AM EDT by a video enabled telemedicine application and verified that I am speaking with the correct person.  I discussed the limitations of evaluation and management by telemedicine and the availability of in person appointments. The patient expressed understanding and agreed to proceed.  History of Present Illness:  She is on Adderall 30 mg tid and it helps her stay awake and alert.   On protriptyline she has not had any more cataplexy.     She feels her LBP and sciatica type pain is doing worse.    TPI's have helped her a lot in the past and she would like to do another one when she is able to be seen in person.  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  The head is normocephalic and atraumatic.  The sclera are anicteric.  Pharynx and tongue have normal appearance.  Visible skin appears normal.  She is alert and fully oriented with fluent speech and good attention, knowledge and memory.  Facial strength and trapezius strength appears normal.  Palatal elevation and tongue protrusion are midline.  Hearing appears to be normal.  Rapid rotating movements, finger-nose-finger and arm strength are normal.  Assessment and Plan: Nonintractable juvenile myoclonic epilepsy without status epilepticus (HCC)  Attention deficit disorder, unspecified hyperactivity presence  Narcolepsy and cataplexy  Bilateral sciatica  1.   Continue lamotrigine for JME. 2.    Continue Adderall 30 mg 3 times a day and protriptyline at night for narcolepsy with cataplexy. 3.    We will schedule follow-up in 3 months.  She needs another injection for her sciatica. 4.   She should call sooner if new or worsening symptoms.   Follow Up Instructions: I discussed the assessment and treatment plan with the patient. The patient was provided an opportunity to ask questions and all were answered. The patient agreed with the plan and demonstrated an understanding of the instructions.  The patient was advised to call back or seek an in-person evaluation if the symptoms worsen or if the condition fails to improve as anticipated.  I provided 25 minutes of non-face-to-face time during this encounter.   Asa Lente, MD  ______________________ From previous visits Update 12/26/2017: She is doing well.    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 She is on lamotrigine and tolerates it well.  Her narcolepsy is doing very well on Adderall.   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   She has no difficulty with sleepiness while driving (just drove from [place]).    She still has hypnic hallucinations (though less than in the past) but no recent sleep paralysis.   Her LBP and buttock pain is  mild to moderate now.   The TPIs have helped a lot to reduce the pain and she gets a benefit for many months.    She denies weakness in her legs but sometimes gets radiating apin and numbness.     Update 06/26/2017: Seizures are doing well.   No GTC and she is not aware of any staring spells,  She feels the morning myoclonus is minimal now.   She continues on lamotrigine and tolerates it well.    Her narcolepsy seems well controlled  on Adderall.    She sleeps well at night.   She has had rare cataplexy, more likely to occur if she misses an Adderall dose.  The addition of protriptyline greatly helped the number of cataplexy episodes.  She still occasionally gets unusual dreams, sometimes of being attacked.   No recent sleep paralysis.     Her sciatica was better for a few months after the last piriformis muscle injections.  Over the last 2 months, however, pain is returning.  Migraines continue to do well and she averages just 1 a month helped by sumatriptan.  If she has nausea she will take the Phenergan.       From 12/26/2016: JME:    She denies any recent seizures. She is on lamotrigine and tolerates it well. She is stable on current med's and has not had any staring spells since 2015-2016.   No GTC since 2008-2009.       She has not had any more morning myoclonus on current dose of lamotrigine.     Seizure history:    She was diagnosed with JME around age 33. 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 Similar staring spells occurred and she had an EEG with a pattern consistent with juvenile myoclonic epilepsy. In the omornings, she would often have mild clonus. She still has mild clonus at times in the morning. At age 33, she had a single convulsive seizure. Initially, she was placed on Depakote but due to some breakthrough staring spells and poor tolerability, she was switched to Lamictal.   Narcolepsy/ADD:    She notes sleepiness but feels that Adderall has controlled it for the most part. She takes Adderall for 30 mg 3 times a day though she will sometimes skips the third dose if she does not need to be focused and awake later in the day. Since starting protriptyline, she has not had any cataplexy and sleep paralysis is very rare, only once a year now . She rarely takes a nap.   On weekdays, she sleeps only 6 hours some weekday and sleeps 12 hours on weekends .    Narcolepsy History:  She was diagnosed with  narcolepsy in 2008 after presenting with severe sleepiness. She had PSG and MSLT in 2008 showing severe hypersomnia and sleep onset REM consistent with narcolepsy. She also had mild OSA on that initial sleep study.   A repeat PSG 06/28/2012 showed no significant OSA (AHI equals 0.2). She did have some snoring. She also had moderate periodic limb movements of sleep with an index of 25.3 and an arousal index of 11.9.. She was placed on stimulants and is currently on Adderall 30 mg 3 times a day. She tolerates this dose well. She feels her hypersomnia does well once she is awake.   She sets 3 alarms because she has trouble getting up many mornings. She gets very rare episodes of cataplexy.   Sleep paralysis events have been helped by  protriptyline.[name] She will have hypnagogic hallucinations at times.  Sciatica/Back pain:   She received 3 or 4 months of benefit from the last piriformis trigger point injection but her pain has now returned. Present it is in the lower back and buttocks and occasionally will radiate into the toes on the left. She is very active and some movements or exercises will increase the pain.       MRI of the lumbar spine around 2013 was normal.        Migraine Headache:  She experiences headaches about 3 or 4 days a month, usually around the time of her period. When she gets a headache she will have pounding pain, photophobia, phonophobia and nausea but no vomiting. Moving makes the headache worse. Ibuprofen combined with sumatriptan has helped her the most.   Mood:   The combination of Prozac and Latuda has helped her Biopolar 2 and depression.         REVIEW OF SYSTEMS: Constitutional: No fevers, chills, sweats, or change in appetite Eyes: No visual changes, double vision, eye pain Ear, nose and throat: No hearing loss, ear pain, nasal congestion, sore throat Cardiovascular: No chest pain, palpitations Respiratory: No shortness of breath at rest or with exertion.   No wheezes  GastrointestinaI: No nausea, vomiting, diarrhea, abdominal pain, fecal incontinence Genitourinary: No dysuria, urinary retention or frequency.  No nocturia. Musculoskeletal: No neck pain.   Back pain as above Integumentary: No rash, pruritus, skin lesions Neurological: as above Psychiatric: No depression at this time.  No anxiety Endocrine: No palpitations, diaphoresis, change in appetite, change in weigh or increased thirst Hematologic/Lymphatic: No anemia, purpura, petechiae. Allergic/Immunologic: No itchy/runny eyes, nasal congestion, recent allergic reactions, rashes  ALLERGIES: Allergies  Allergen Reactions  . Dust Mite Extract Itching  . Mold Extract [Trichophyton] Itching    HOME MEDICATIONS:  Current Outpatient Medications:  .  amphetamine-dextroamphetamine (ADDERALL) 30 MG tablet, Take 1 tablet by mouth 3 (three) times daily., Disp: 90 tablet, Rfl: 0 .  FLUoxetine (PROZAC) 40 MG capsule, Take 40 mg by mouth daily., Disp: , Rfl:  .  lamoTRIgine (LAMICTAL) 200 MG tablet, Take 1 tablet (200 mg total) by mouth 2 (two) times daily., Disp: 180 tablet, Rfl: 4 .  LORazepam (ATIVAN) 0.5 MG tablet, Take 0.5 mg by mouth every 8 (eight) hours as needed for anxiety., Disp: , Rfl:  .  promethazine (PHENERGAN) 25 MG tablet, Take 1 tablet (25 mg total) by mouth every 6 (six) hours as needed for nausea., Disp: 30 tablet, Rfl: 1 .  protriptyline (VIVACTIL) 10 MG tablet, Take 1 tablet (10 mg total) by mouth at bedtime., Disp: 90 tablet, Rfl: 4 .  SUMAtriptan (IMITREX) 100 MG tablet, Take one po prn headache.   May repeat in 2 hours if headache persists or recurs., Disp: 30 tablet, Rfl: 3  PAST MEDICAL HISTORY: Past Medical History:  Diagnosis Date  . Anxiety   . Colitis 2003  . Depression   . Epilepsy (HCC)   . Headache(784.0)   . Hyperlipidemia   . Migraines   . Narcolepsy   . Narcolepsy   . Seizures (HCC)    Petit mal    PAST SURGICAL HISTORY: Past Surgical History:   Procedure Laterality Date  . ADENOIDECTOMY  1998  . BILATERAL HIP ARTHROSCOPY  2011, 2012  . WRIST SURGERY Right 2004    FAMILY HISTORY: Family History  Problem Relation Age of Onset  . Depression Mother   . Anxiety disorder Mother   .  Depression Father   . Anxiety disorder Maternal Aunt   . Depression Maternal Aunt   . Bipolar disorder Maternal Uncle   . Schizophrenia Maternal Grandfather   . Bipolar disorder Cousin     SOCIAL HISTORY:  Social History   Socioeconomic History  . Marital status: Single    Spouse name: Not on file  . Number of children: Not on file  . Years of education: Not on file  . Highest education level: Not on file  Occupational History  . Not on file  Social Needs  . Financial resource strain: Not on file  . Food insecurity:    Worry: Not on file    Inability: Not on file  . Transportation needs:    Medical: Not on file    Non-medical: Not on file  Tobacco Use  . Smoking status: Never Smoker  . Smokeless tobacco: Never Used  Substance and Sexual Activity  . Alcohol use: Yes    Alcohol/week: 1.0 standard drinks    Types: 1 Glasses of wine per week    Comment: Rare  . Drug use: No  . Sexual activity: Never  Lifestyle  . Physical activity:    Days per week: Not on file    Minutes per session: Not on file  . Stress: Not on file  Relationships  . Social connections:    Talks on phone: Not on file    Gets together: Not on file    Attends religious service: Not on file    Active member of club or organization: Not on file    Attends meetings of clubs or organizations: Not on file    Relationship status: Not on file  . Intimate partner violence:    Fear of current or ex partner: Not on file    Emotionally abused: Not on file    Physically abused: Not on file    Forced sexual activity: Not on file  Other Topics Concern  . Not on file  Social History Narrative  . Not on file     PHYSICAL EXAM  There were no vitals filed for  this visit.  General: The patient is well-developed and well-nourished and in no acute distress   Musculoskeletal:   She has moderate tenderness over the piriformis muscles bilaterally.  There is minimal tenderness over the trochanteric bursae and no tenderness over the lower lumbar paraspinal muscles.  Range of motion is normal in the back. Neurologic Exam  Mental status: The patient is alert and oriented x 3 at the time of the examination. The patient has apparent normal recent and remote memory, with an apparently normal attention span and concentration ability.   Speech is normal.  Cranial nerves: Extraocular movements are full.  Facial strength and sensation is normal.  The trapezius strength is normal.  Her hearing is symmetric.  Motor:  Muscle bulk is normal.  Muscle tone and muscle strength are normal.  Sensory:   She had intact sensation to touch in the arms and legs.  Coordination: Cerebellar testing reveals good finger-nose-finger and heel-to-shin bilaterally.  Gait and station: Station is normal.  Gait and tandem gait are normal.  Reflexes: Deep tendon reflexes are symmetric and normal bilaterally.        DIAGNOSTIC DATA (LABS, IMAGING, TESTING) - I reviewed patient records, labs, notes, testing and imaging myself where available.  Lab Results  Component Value Date   WBC 10.3 05/03/2013   HGB 12.3 05/03/2013   HCT 36.5 05/03/2013  MCV 91.7 05/03/2013   PLT 200 05/03/2013      Component Value Date/Time   NA 137 05/03/2013 1358   K 4.4 05/03/2013 1358   CL 96 05/03/2013 1358   CO2 28 05/03/2013 1358   GLUCOSE 111 (H) 05/03/2013 1358   BUN 11 05/03/2013 1358   CREATININE 0.69 05/03/2013 1358   CALCIUM 9.8 05/03/2013 1358   PROT 7.3 05/03/2013 1358   ALBUMIN 4.1 05/03/2013 1358   AST 21 05/03/2013 1358   ALT 13 05/03/2013 1358   ALKPHOS 82 05/03/2013 1358   BILITOT 0.6 05/03/2013 1358   GFRNONAA >90 05/03/2013 1358   GFRAA >90 05/03/2013 1358        ASSESSMENT AND PLAN  Nonintractable juvenile myoclonic epilepsy without status epilepticus (HCC)  Attention deficit disorder, unspecified hyperactivity presence  Narcolepsy and cataplexy  Bilateral sciatica   Richard A. Epimenio Foot, MD, PhD 06/15/2018, 10:50 AM Certified in Neurology, Clinical Neurophysiology, Sleep Medicine, Pain Medicine and Neuroimaging  [facility] [address] [place], [place] [address] [phone]

## 2018-07-20 ENCOUNTER — Other Ambulatory Visit: Payer: Self-pay | Admitting: Neurology

## 2018-07-29 ENCOUNTER — Other Ambulatory Visit: Payer: Self-pay | Admitting: Neurology

## 2018-07-29 NOTE — Telephone Encounter (Signed)
Took call from phone staff. She is wondering if she should book flight for her f/u 09/07/18 for in office visit from [facility]. Advised I will discuss with Dr. Epimenio Foot and call back in the morning and look at refill request in the morning. She verbalized understanding.

## 2018-07-29 NOTE — Telephone Encounter (Signed)
Pt asking for a refill on her  amphetamine-dextroamphetamine (ADDERALL) 30 MG tablet  [facility] [phone]

## 2018-07-30 MED ORDER — AMPHETAMINE-DEXTROAMPHETAMINE 30 MG PO TABS: 30.0000 mg | ORAL_TABLET | Freq: Three times a day (TID) | ORAL | 0 refills | Status: DC

## 2018-07-30 NOTE — Addendum Note (Signed)
Addended by: Hillis Range on: 07/30/2018 07:42 AM   Modules accepted: Orders

## 2018-09-03 ENCOUNTER — Telehealth: Payer: Self-pay | Admitting: Neurology

## 2018-09-03 NOTE — Telephone Encounter (Signed)
Called pt back. pt r/s from 09/07/18 to 09/28/18 at 130pm. She is coming from [facility] and was not comfortable flying d/t increase of covid-19 cases.

## 2018-09-03 NOTE — Telephone Encounter (Signed)
Pt has called asking if Dr Felecia Shelling wants her to come  In on Monday considering she is coming from [facility] Pt is asking for a call back from RN to discuss the idea of r/s

## 2018-09-07 ENCOUNTER — Ambulatory Visit: Payer: Self-pay | Admitting: Neurology

## 2018-09-23 ENCOUNTER — Other Ambulatory Visit: Payer: Self-pay | Admitting: Neurology

## 2018-09-23 MED ORDER — AMPHETAMINE-DEXTROAMPHETAMINE 30 MG PO TABS: 30.0000 mg | ORAL_TABLET | Freq: Three times a day (TID) | ORAL | 0 refills | Status: DC

## 2018-09-23 NOTE — Addendum Note (Signed)
Addended by: Hope Pigeon on: 09/23/2018 04:14 PM   Modules accepted: Orders

## 2018-09-23 NOTE — Telephone Encounter (Signed)
Pt has called for a refill on her  amphetamine-dextroamphetamine (ADDERALL) 30 MG tablet  [id number]  

## 2018-09-23 NOTE — Telephone Encounter (Signed)
I called [facility] and spoke with pharmacy tech. States pt last refilled rx adderall 07/30/18.

## 2018-09-25 ENCOUNTER — Telehealth: Payer: Self-pay | Admitting: Neurology

## 2018-09-25 NOTE — Telephone Encounter (Signed)
Pt called wanting to know if her appt can be moved to either tues or wed. Please advise.

## 2018-09-28 ENCOUNTER — Ambulatory Visit: Payer: Self-pay | Admitting: Neurology

## 2018-09-28 NOTE — Telephone Encounter (Signed)
I called pt. Appt cx today, r/s for 10/06/2018 at 130pm with Dr. Felecia Shelling. She was still in [facility] and could not make appt today. She was waiting to hear back from our office to see if she could come another day

## 2018-10-06 ENCOUNTER — Ambulatory Visit: Payer: Self-pay | Admitting: Neurology

## 2018-10-06 ENCOUNTER — Encounter: Payer: Self-pay | Admitting: Neurology

## 2018-10-06 ENCOUNTER — Other Ambulatory Visit: Payer: Self-pay

## 2018-10-06 VITALS — BP 120/80 | HR 67 | Temp 98.0°F | Ht <= 58 in | Wt 82.5 lb

## 2018-10-06 DIAGNOSIS — G40B09 Juvenile myoclonic epilepsy, not intractable, without status epilepticus: Secondary | ICD-10-CM

## 2018-10-06 DIAGNOSIS — G47411 Narcolepsy with cataplexy: Secondary | ICD-10-CM

## 2018-10-06 DIAGNOSIS — M5432 Sciatica, left side: Secondary | ICD-10-CM

## 2018-10-06 DIAGNOSIS — F988 Other specified behavioral and emotional disorders with onset usually occurring in childhood and adolescence: Secondary | ICD-10-CM

## 2018-10-06 DIAGNOSIS — M5431 Sciatica, right side: Secondary | ICD-10-CM

## 2018-10-06 NOTE — Progress Notes (Signed)
[facility]  PATIENT: Abigail Lowery DOB: 1985/10/13  REFERRING DOCTOR OR PCP:  Adaku Nnodi SOURCE: patient  _________________________________   HISTORICAL  CHIEF COMPLAINT:  Chief Complaint  Patient presents with   Follow-up    RM 12, alone. Last seen 06/15/2018. Takes adderall 30mg  TID and protriptyline (narcolepsy)   Seizures    JME, taking lamotrigine   Injections    Requesting injection-sciatica. Depo-medrol Lot: [id number]. Expiration: 01/2019. NDC: Z6519364. Marcaine: Lot: [id number]. Expiration: 03/2019. NDC: 47829-562-13    HISTORY OF PRESENT ILLNESS:  Abigail Lowery is a 33 y.o. woman with juvenile myoclonic epilepsy , narcolepsy, sciatica and miigraine headaches and sciatica.     Update 10/06/2018: Her narcolepsy is doing well on Adderall 30 mg po tid.   She is doing well with the cataplexy events since she started protriptyline.    We discussed it is a n appetite suppressant and would like to see her gain more weight.  She denies any more seizures.   No GTC seizure x years and no recent staring spell.      Her sciatica type pain is worsening again.    She would like another trigger point injection.     Migraines are doing well, getting a few every month.   They did better after starting protriptyline.  Imitrex helps if one occurs.     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       Update 06/15/2018: She is on Adderall 30 mg tid and it helps her stay awake and alert.   On protriptyline she has not had any more cataplexy.     She feels her LBP and sciatica type pain is doing worse.    TPI's have helped her a lot in the past and she would like to do another one when she is able to be seen in person.  She works as a Oceanographer at the [employer] in [place] and is in the process of getting unemployment  From previous visits Update 12/26/2017: She is doing well.     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 She is on lamotrigine and tolerates it well.  Her narcolepsy is doing very well on Adderall.   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   She has no difficulty with sleepiness while driving (just drove from [place]).    She still has hypnic hallucinations (though less than in the past) but no recent sleep paralysis.   Her LBP and buttock pain is mild to moderate now.   The TPIs have helped a lot to reduce the pain and she gets a benefit for many months.    She denies weakness in her legs but sometimes gets radiating apin and numbness.     Update 06/26/2017: Seizures are doing well.   No GTC and she is not aware of any staring spells,  She feels the morning myoclonus is minimal now.   She continues on lamotrigine and tolerates it well.    Her narcolepsy seems well controlled on Adderall.    She sleeps well at night.   She has had rare cataplexy, more likely to occur if she misses an Adderall dose.  The addition of protriptyline greatly helped the number of cataplexy episodes.  She  still occasionally gets unusual dreams, sometimes of being attacked.   No recent sleep paralysis.     Her sciatica was better for a few months after the last piriformis muscle injections.  Over the last 2 months, however, pain is returning.  Migraines continue to do well and she averages just 1 a month helped by sumatriptan.  If she has nausea she will take the Phenergan.       From 12/26/2016: JME:    She denies any recent seizures. She is on lamotrigine and tolerates it well. She is stable on current med's and has not had any staring spells since 2015-2016.   No GTC since 2008-2009.       She has not had any more morning myoclonus on current dose of lamotrigine.     Seizure history:    She was diagnosed with JME around age 33. While being a cheerleader, she froze for about  5 seconds. Similar staring spells occurred and she had an EEG with a pattern consistent with juvenile myoclonic epilepsy. In the omornings, she would often have mild clonus. She still has mild clonus at times in the morning. At age 33, she had a single convulsive seizure. Initially, she was placed on Depakote but due to some breakthrough staring spells and poor tolerability, she was switched to Lamictal.   Narcolepsy/ADD:    She notes sleepiness but feels that Adderall has controlled it for the most part. She takes Adderall for 30 mg 3 times a day though she will sometimes skips the third dose if she does not need to be focused and awake later in the day. Since starting protriptyline, she has not had any cataplexy and sleep paralysis is very rare, only once a year now . She rarely takes a nap.   On weekdays, she sleeps only 6 hours some weekday and sleeps 12 hours on weekends .    Narcolepsy History:  She was diagnosed with narcolepsy in 2008 after presenting with severe sleepiness. She had PSG and MSLT in 2008 showing severe hypersomnia and sleep onset REM consistent with narcolepsy. She also had mild OSA on that initial sleep study.   A repeat PSG 06/28/2012 showed no significant OSA (AHI equals 0.2). She did have some snoring. She also had moderate periodic limb movements of sleep with an index of 25.3 and an arousal index of 11.9.. She was placed on stimulants and is currently on Adderall 30 mg 3 times a day. She tolerates this dose well. She feels her hypersomnia does well once she is awake.   She sets 3 alarms because she has trouble getting up many mornings. She gets very rare episodes of cataplexy.   Sleep paralysis events have been helped by protriptyline.[name] She will have hypnagogic hallucinations at times.  Sciatica/Back pain:   She received 3 or 4 months of benefit from the last piriformis trigger point injection but her pain has now returned. Present it is in the lower back and buttocks and  occasionally will radiate into the toes on the left. She is very active and some movements or exercises will increase the pain.       MRI of the lumbar spine around 2013 was normal.        Migraine Headache:  She experiences headaches about 3 or 4 days a month, usually around the time of her period. When she gets a headache she will have pounding pain, photophobia, phonophobia and nausea but no vomiting. Moving makes the headache worse. Ibuprofen  combined with sumatriptan has helped her the most.   Mood:   The combination of Prozac and Latuda has helped her Biopolar 2 and depression.         REVIEW OF SYSTEMS: Constitutional: No fevers, chills, sweats, or change in appetite Eyes: No visual changes, double vision, eye pain Ear, nose and throat: No hearing loss, ear pain, nasal congestion, sore throat Cardiovascular: No chest pain, palpitations Respiratory: No shortness of breath at rest or with exertion.   No wheezes GastrointestinaI: No nausea, vomiting, diarrhea, abdominal pain, fecal incontinence Genitourinary: No dysuria, urinary retention or frequency.  No nocturia. Musculoskeletal: No neck pain.   Back pain as above Integumentary: No rash, pruritus, skin lesions Neurological: as above Psychiatric: No depression at this time.  No anxiety Endocrine: No palpitations, diaphoresis, change in appetite, change in weigh or increased thirst Hematologic/Lymphatic: No anemia, purpura, petechiae. Allergic/Immunologic: No itchy/runny eyes, nasal congestion, recent allergic reactions, rashes  ALLERGIES: Allergies  Allergen Reactions   Dust Mite Extract Itching   Mold Extract [Trichophyton] Itching    HOME MEDICATIONS:  Current Outpatient Medications:    amphetamine-dextroamphetamine (ADDERALL) 30 MG tablet, Take 1 tablet by mouth 3 (three) times daily., Disp: 90 tablet, Rfl: 0   FLUoxetine (PROZAC) 40 MG capsule, Take 1 capsule (40 mg total) by mouth daily., Disp: 90 capsule, Rfl:  4   lamoTRIgine (LAMICTAL) 200 MG tablet, Take 1 tablet (200 mg total) by mouth 2 (two) times daily., Disp: 180 tablet, Rfl: 4   LORazepam (ATIVAN) 0.5 MG tablet, Take 1 tablet (0.5 mg total) by mouth every 8 (eight) hours as needed for anxiety., Disp: 90 tablet, Rfl: 0   promethazine (PHENERGAN) 25 MG tablet, Take 1 tablet (25 mg total) by mouth every 6 (six) hours as needed for nausea., Disp: 30 tablet, Rfl: 1   protriptyline (VIVACTIL) 10 MG tablet, Take 1 tablet (10 mg total) by mouth at bedtime., Disp: 90 tablet, Rfl: 4   SUMAtriptan (IMITREX) 100 MG tablet, Take one po prn headache.   May repeat in 2 hours if headache persists or recurs., Disp: 30 tablet, Rfl: 3  PAST MEDICAL HISTORY: Past Medical History:  Diagnosis Date   Anxiety    Colitis 2003   Depression    Epilepsy (HCC)    Headache(784.0)    Hyperlipidemia    Migraines    Narcolepsy    Narcolepsy    Seizures (HCC)    Petit mal    PAST SURGICAL HISTORY: Past Surgical History:  Procedure Laterality Date   ADENOIDECTOMY  1998   BILATERAL HIP ARTHROSCOPY  2011, 2012   WRIST SURGERY Right 2004    FAMILY HISTORY: Family History  Problem Relation Age of Onset   Depression Mother    Anxiety disorder Mother    Depression Father    Anxiety disorder Maternal Aunt    Depression Maternal Aunt    Bipolar disorder Maternal Uncle    Schizophrenia Maternal Grandfather    Bipolar disorder Cousin     SOCIAL HISTORY:  Social History   Socioeconomic History   Marital status: Single    Spouse name: Not on file   Number of children: Not on file   Years of education: Not on file   Highest education level: Not on file  Occupational History   Not on file  Social Needs   Financial resource strain: Not on file   Food insecurity    Worry: Not on file    Inability: Not on file  Transportation needs    Medical: Not on file    Non-medical: Not on file  Tobacco Use   Smoking status:  Never Smoker   Smokeless tobacco: Never Used  Substance and Sexual Activity   Alcohol use: Yes    Alcohol/week: 1.0 standard drinks    Types: 1 Glasses of wine per week    Comment: Rare   Drug use: No   Sexual activity: Never  Lifestyle   Physical activity    Days per week: Not on file    Minutes per session: Not on file   Stress: Not on file  Relationships   Social connections    Talks on phone: Not on file    Gets together: Not on file    Attends religious service: Not on file    Active member of club or organization: Not on file    Attends meetings of clubs or organizations: Not on file    Relationship status: Not on file   Intimate partner violence    Fear of current or ex partner: Not on file    Emotionally abused: Not on file    Physically abused: Not on file    Forced sexual activity: Not on file  Other Topics Concern   Not on file  Social History Narrative   Not on file     PHYSICAL EXAM  Vitals:   10/06/18 1345  BP: 120/80  Pulse: 67  Temp: 98 F (36.7 C)    General: The patient is well-developed and well-nourished and in no acute distress   Musculoskeletal:   She has moderate tenderness over the piriformis muscles bilaterally.  Range of motion is normal in the back.  Neurologic Exam  Mental status: The patient is alert and oriented x 3 at the time of the examination. The patient has apparent normal recent and remote memory, with an apparently normal attention span and concentration ability.   Speech is normal.  Cranial nerves: Extraocular movements are full.  Facial strength and sensation is normal.  The trapezius strength is normal.  Her hearing is symmetric.  Motor:  Muscle bulk is normal.  Muscle tone and muscle strength are normal.  Sensory:   She had intact sensation to touch in the arms and legs.  Coordination: Cerebellar testing reveals good finger-nose-finger and heel-to-shin bilaterally.  Gait and station: Station is normal.   Gait and tandem gait are normal.  Reflexes: Deep tendon reflexes are symmetric and normal bilaterally.        DIAGNOSTIC DATA (LABS, IMAGING, TESTING) - I reviewed patient records, labs, notes, testing and imaging myself where available.  Lab Results  Component Value Date   WBC 10.3 05/03/2013   HGB 12.3 05/03/2013   HCT 36.5 05/03/2013   MCV 91.7 05/03/2013   PLT 200 05/03/2013      Component Value Date/Time   NA 137 05/03/2013 1358   K 4.4 05/03/2013 1358   CL 96 05/03/2013 1358   CO2 28 05/03/2013 1358   GLUCOSE 111 (H) 05/03/2013 1358   BUN 11 05/03/2013 1358   CREATININE 0.69 05/03/2013 1358   CALCIUM 9.8 05/03/2013 1358   PROT 7.3 05/03/2013 1358   ALBUMIN 4.1 05/03/2013 1358   AST 21 05/03/2013 1358   ALT 13 05/03/2013 1358   ALKPHOS 82 05/03/2013 1358   BILITOT 0.6 05/03/2013 1358   GFRNONAA >90 05/03/2013 1358   GFRAA >90 05/03/2013 1358       ASSESSMENT AND PLAN    1. Nonintractable juvenile  myoclonic epilepsy without status epilepticus (HCC)   2. Bilateral sciatica   3. Narcolepsy and cataplexy   4. Attention deficit disorder, unspecified hyperactivity presence     1.   Continue lamotrigine for the juvenile myoclonic epilepsy. 2.  Continue Adderall and protriptyline for narcolepsy.  We did discuss that Adderall is an appetite suppressant and if she loses more weight we may need to adjust the dose down. 3.  Imitrex as needed migraine. 4.   Bilateral piriformis muscle trigger point injections with 80 mg Depo-Medrol in 5 cc Marcaine using sterile technique.  She tolerated the procedure well and there were no complications. 5.   Return in 4 to 5 months or sooner if there are new or worsening neurologic symptoms. Damyan Corne A. Epimenio FootSater, MD, PhD 10/06/2018, 6:05 PM Certified in Neurology, Clinical Neurophysiology, Sleep Medicine, Pain Medicine and Neuroimaging  [facility] [address], [address] [place], [place] [id number] [phone]

## 2018-11-10 ENCOUNTER — Other Ambulatory Visit: Payer: Self-pay

## 2018-11-10 MED ORDER — AMPHETAMINE-DEXTROAMPHETAMINE 30 MG PO TABS: 30.0000 mg | ORAL_TABLET | Freq: Three times a day (TID) | ORAL | 0 refills | Status: DC

## 2018-11-10 NOTE — Telephone Encounter (Signed)
Patient has lost quite a bit of weight over time, she is on high-dose Adderall, I will cover for Dr. Garth Bigness prescription in his absence but in reviewing her chart, she is very low weight at this time. Please review.

## 2018-11-10 NOTE — Addendum Note (Signed)
Addended by: Hope Pigeon on: 11/10/2018 05:10 PM   Modules accepted: Orders

## 2018-11-10 NOTE — Addendum Note (Signed)
Addended by: Star Age on: 11/10/2018 05:23 PM   Modules accepted: Orders

## 2018-11-10 NOTE — Telephone Encounter (Signed)
Patient called needing a refill on her amphetamine-dextroamphetamine (ADDERALL) 30 MG tablet

## 2019-01-04 ENCOUNTER — Other Ambulatory Visit: Payer: Self-pay | Admitting: Neurology

## 2019-01-04 MED ORDER — AMPHETAMINE-DEXTROAMPHETAMINE 30 MG PO TABS: 30.0000 mg | ORAL_TABLET | Freq: Three times a day (TID) | ORAL | 0 refills | Status: DC

## 2019-01-04 NOTE — Telephone Encounter (Signed)
Pt is requesting a refill of amphetamine-dextroamphetamine (ADDERALL) 30 MG tablet, to be sent to [facility], [place] [address] [place] [address]

## 2019-02-08 ENCOUNTER — Encounter: Payer: Self-pay | Admitting: Neurology

## 2019-02-08 ENCOUNTER — Telehealth (INDEPENDENT_AMBULATORY_CARE_PROVIDER_SITE_OTHER): Payer: No Typology Code available for payment source | Admitting: Neurology

## 2019-02-08 DIAGNOSIS — G47411 Narcolepsy with cataplexy: Secondary | ICD-10-CM | POA: Diagnosis not present

## 2019-02-08 DIAGNOSIS — G40B09 Juvenile myoclonic epilepsy, not intractable, without status epilepticus: Secondary | ICD-10-CM | POA: Diagnosis not present

## 2019-02-08 DIAGNOSIS — M5432 Sciatica, left side: Secondary | ICD-10-CM

## 2019-02-08 DIAGNOSIS — G43009 Migraine without aura, not intractable, without status migrainosus: Secondary | ICD-10-CM | POA: Diagnosis not present

## 2019-02-08 DIAGNOSIS — F988 Other specified behavioral and emotional disorders with onset usually occurring in childhood and adolescence: Secondary | ICD-10-CM | POA: Diagnosis not present

## 2019-02-08 DIAGNOSIS — M5431 Sciatica, right side: Secondary | ICD-10-CM

## 2019-02-08 MED ORDER — AMPHETAMINE-DEXTROAMPHETAMINE 30 MG PO TABS: 30.0000 mg | ORAL_TABLET | Freq: Three times a day (TID) | ORAL | 0 refills | Status: DC

## 2019-02-08 NOTE — Progress Notes (Signed)
[facility]  PATIENT: Abigail Lowery DOB: 04-17-1985  REFERRING DOCTOR OR PCP:  Adaku Nnodi SOURCE: patient  _________________________________   HISTORICAL  CHIEF COMPLAINT:  No chief complaint on file.   HISTORY OF PRESENT ILLNESS:  Abigail Lowery is a 33 y.o. woman with juvenile myoclonic epilepsy , narcolepsy, migraine headaches and sciatica.     Update 02/08/2019: Virtual Visit via Video Note I connected with Abigail Lowery  on 02/08/19 at 11:30 AM EST by a video enabled telemedicine application and verified that I am speaking with the correct person.  I discussed the limitations of evaluation and management by telemedicine and the availability of in person appointments. The patient expressed understanding and agreed to proceed.  History of Present Illness: She feels her narcolepsy is reasonably well controlled.   She has shifted her nights to 2 am to noon.     She is staying awake and laer twith her medications.    The protriptyline has helped her cataplexy and this occurs rarely (once every 2-3 moths) now.      She denies any seizures and no one has commented on staring spells.   She tolerates the lamotrigine.      She has sciatica/piriformis pain and has received benefits form TPIs.   The last TPi was about 4 months ago.       Migraines have done well.   She takes sumatriptan if the migraine is more severe.   Observations/Objective  She is a well-developed well-nourished woman in no acute distress.  The head is normocephalic and atraumatic.  Sclera are anicteric.  Visible skin appears normal.  The neck has a good range of motion.  Pharynx and tongue have normal appearance.  She is alert and fully oriented with fluent speech and good attention, knowledge and memory.  Extraocular muscles are intact.  Facial strength is normal.  Palatal elevation and tongue protrusion are midline.  She appears to have normal strength in the arms.  Rapid alternating movements  and finger-nose-finger are performed well.  Assessment and Plan: Nonintractable juvenile myoclonic epilepsy without status epilepticus (HCC)  Narcolepsy and cataplexy  Attention deficit disorder, unspecified hyperactivity presence  Migraine without aura and without status migrainosus, not intractable  Bilateral sciatica  1.  Continue Adderall for sleepiness and protriptyline for cataplexy associated with her narcolepsy.  I will send in a new refill.  This is also helping her attention deficit. 2.   Continue lamotrigine for JME. 3.   Migraines are actually done better since she started protriptyline for narcolepsy.  If migraine occurs she can take OTC meds or sumatriptan. 4.   Continue to try to stretch.  She does have an active job.  If sciatica worsens and she is in town we can do another trigger point injection .5.   Return 4 months or sooner if there are new or worsening neurologic symptoms.   Follow Up Instructions: I discussed the assessment and treatment plan with the patient. The patient was provided an opportunity to ask questions and all were answered. The patient agreed with the plan and demonstrated an understanding of the instructions.    The patient was advised to call back or seek an in-person evaluation if the symptoms worsen or if the condition fails to improve as anticipated.  I provided 25 minutes of non-face-to-face time during this encounter.   Update 10/06/2018: Her narcolepsy is doing well on Adderall 30 mg po tid.   She is doing well with the cataplexy  events since she started protriptyline.    We discussed it is a n appetite suppressant and would like to see her gain more weight.  She denies any more seizures.   No GTC seizure x years and no recent staring spell.      Her sciatica type pain is worsening again.    She would like another trigger point injection.     Migraines are doing well, getting a few every month.   They did better after starting  protriptyline.  Imitrex helps if one occurs.     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       Update 06/15/2018: She is on Adderall 30 mg tid and it helps her stay awake and alert.   On protriptyline she has not had any more cataplexy.     She feels her LBP and sciatica type pain is doing worse.    TPI's have helped her a lot in the past and she would like to do another one when she is able to be seen in person.  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 She is on lamotrigine and tolerates it well.  Her narcolepsy is doing very well on Adderall.   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   She has no difficulty with sleepiness while driving (just drove from [place]).    She still has hypnic hallucinations (though less than in the past) but no recent sleep paralysis.   Her LBP and buttock pain is mild to moderate now.   The TPIs have helped a lot to reduce the pain and she gets a benefit for many months.    She denies weakness in her legs but sometimes gets radiating apin and numbness.     Update 06/26/2017: Seizures are doing well.   No GTC and she is not aware of any staring spells,  She feels the morning myoclonus is minimal now.   She continues on lamotrigine and tolerates it well.    Her narcolepsy seems well controlled on Adderall.    She sleeps well at night.   She has had rare cataplexy, more likely to occur if she misses an Adderall dose.  The addition of protriptyline greatly helped the number of cataplexy episodes.  She still occasionally gets unusual dreams, sometimes of being attacked.   No recent sleep paralysis.     Her sciatica  was better for a few months after the last piriformis muscle injections.  Over the last 2 months, however, pain is returning.  Migraines continue to do well and she averages just 1 a month helped by sumatriptan.  If she has nausea she will take the Phenergan.       From 12/26/2016: JME:    She denies any recent seizures. She is on lamotrigine and tolerates it well. She is stable on current med's and has not had any staring spells since 2015-2016.   No GTC since 2008-2009.       She has not had any more morning myoclonus on current dose of lamotrigine.  Seizure history:    She was diagnosed with JME around age 33. 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 Similar staring spells occurred and she had an EEG with a pattern consistent with juvenile myoclonic epilepsy. In the omornings, she would often have mild clonus. She still has mild clonus at times in the morning. At age 33, she had a single convulsive seizure. Initially, she was placed on Depakote but due to some breakthrough staring spells and poor tolerability, she was switched to Lamictal.   Narcolepsy/ADD:    She notes sleepiness but feels that Adderall has controlled it for the most part. She takes Adderall for 30 mg 3 times a day though she will sometimes skips the third dose if she does not need to be focused and awake later in the day. Since starting protriptyline, she has not had any cataplexy and sleep paralysis is very rare, only once a year now . She rarely takes a nap.   On weekdays, she sleeps only 6 hours some weekday and sleeps 12 hours on weekends .    Narcolepsy History:  She was diagnosed with narcolepsy in 2008 after presenting with severe sleepiness. She had PSG and MSLT in 2008 showing severe hypersomnia and sleep onset REM consistent with narcolepsy. She also had mild OSA on that initial sleep study.   A repeat PSG 06/28/2012 showed no significant OSA (AHI equals 0.2). She did have some snoring. She also had  moderate periodic limb movements of sleep with an index of 25.3 and an arousal index of 11.9.. She was placed on stimulants and is currently on Adderall 30 mg 3 times a day. She tolerates this dose well. She feels her hypersomnia does well once she is awake.   She sets 3 alarms because she has trouble getting up many mornings. She gets very rare episodes of cataplexy.   Sleep paralysis events have been helped by protriptyline.[name]. She will have hypnagogic hallucinations at times.  Sciatica/Back pain:   She received 3 or 4 months of benefit from the last piriformis trigger point injection but her pain has now returned. Present it is in the lower back and buttocks and occasionally will radiate into the toes on the left. She is very active and some movements or exercises will increase the pain.       MRI of the lumbar spine around 2013 was normal.        Migraine Headache:  She experiences headaches about 3 or 4 days a month, usually around the time of her period. When she gets a headache she will have pounding pain, photophobia, phonophobia and nausea but no vomiting. Moving makes the headache worse. Ibuprofen combined with sumatriptan has helped her the most.   Mood:   The combination of Prozac and Latuda has helped her Biopolar 2 and depression.         REVIEW OF SYSTEMS: Constitutional: No fevers, chills, sweats, or change in appetite Eyes: No visual changes, double vision, eye pain Ear, nose and throat: No hearing loss, ear pain, nasal congestion, sore throat Cardiovascular: No chest pain, palpitations Respiratory: No shortness of breath at rest or with exertion.   No wheezes GastrointestinaI: No nausea, vomiting, diarrhea, abdominal pain, fecal incontinence Genitourinary: No dysuria, urinary retention or frequency.  No nocturia. Musculoskeletal: No neck pain.   Back pain as above Integumentary: No rash, pruritus, skin lesions Neurological: as above Psychiatric: No depression at this time.  No  anxiety Endocrine: No palpitations, diaphoresis, change  in appetite, change in weigh or increased thirst Hematologic/Lymphatic: No anemia, purpura, petechiae. Allergic/Immunologic: No itchy/runny eyes, nasal congestion, recent allergic reactions, rashes  ALLERGIES: Allergies  Allergen Reactions  . Dust Mite Extract Itching  . Mold Extract [Trichophyton] Itching    HOME MEDICATIONS:  Current Outpatient Medications:  .  amphetamine-dextroamphetamine (ADDERALL) 30 MG tablet, Take 1 tablet by mouth 3 (three) times daily., Disp: 90 tablet, Rfl: 0 .  FLUoxetine (PROZAC) 40 MG capsule, Take 1 capsule (40 mg total) by mouth daily., Disp: 90 capsule, Rfl: 4 .  lamoTRIgine (LAMICTAL) 200 MG tablet, Take 1 tablet (200 mg total) by mouth 2 (two) times daily., Disp: 180 tablet, Rfl: 4 .  LORazepam (ATIVAN) 0.5 MG tablet, Take 1 tablet (0.5 mg total) by mouth every 8 (eight) hours as needed for anxiety., Disp: 90 tablet, Rfl: 0 .  promethazine (PHENERGAN) 25 MG tablet, Take 1 tablet (25 mg total) by mouth every 6 (six) hours as needed for nausea., Disp: 30 tablet, Rfl: 1 .  protriptyline (VIVACTIL) 10 MG tablet, Take 1 tablet (10 mg total) by mouth at bedtime., Disp: 90 tablet, Rfl: 4 .  SUMAtriptan (IMITREX) 100 MG tablet, Take one po prn headache.   May repeat in 2 hours if headache persists or recurs., Disp: 30 tablet, Rfl: 3  PAST MEDICAL HISTORY: Past Medical History:  Diagnosis Date  . Anxiety   . Colitis 2003  . Depression   . Epilepsy (HCC)   . Headache(784.0)   . Hyperlipidemia   . Migraines   . Narcolepsy   . Narcolepsy   . Seizures (HCC)    Petit mal    PAST SURGICAL HISTORY: Past Surgical History:  Procedure Laterality Date  . ADENOIDECTOMY  1998  . BILATERAL HIP ARTHROSCOPY  2011, 2012  . WRIST SURGERY Right 2004    FAMILY HISTORY: Family History  Problem Relation Age of Onset  . Depression Mother   . Anxiety disorder Mother   . Depression Father   . Anxiety  disorder Maternal Aunt   . Depression Maternal Aunt   . Bipolar disorder Maternal Uncle   . Schizophrenia Maternal Grandfather   . Bipolar disorder Cousin     SOCIAL HISTORY:  Social History   Socioeconomic History  . Marital status: Single    Spouse name: Not on file  . Number of children: Not on file  . Years of education: Not on file  . Highest education level: Not on file  Occupational History  . Not on file  Social Needs  . Financial resource strain: Not on file  . Food insecurity    Worry: Not on file    Inability: Not on file  . Transportation needs    Medical: Not on file    Non-medical: Not on file  Tobacco Use  . Smoking status: Never Smoker  . Smokeless tobacco: Never Used  Substance and Sexual Activity  . Alcohol use: Yes    Alcohol/week: 1.0 standard drinks    Types: 1 Glasses of wine per week    Comment: Rare  . Drug use: No  . Sexual activity: Never  Lifestyle  . Physical activity    Days per week: Not on file    Minutes per session: Not on file  . Stress: Not on file  Relationships  . Social Musician on phone: Not on file    Gets together: Not on file    Attends religious service: Not on file  Active member of club or organization: Not on file    Attends meetings of clubs or organizations: Not on file    Relationship status: Not on file  . Intimate partner violence    Fear of current or ex partner: Not on file    Emotionally abused: Not on file    Physically abused: Not on file    Forced sexual activity: Not on file  Other Topics Concern  . Not on file  Social History Narrative  . Not on file     PHYSICAL EXAM from 10/06/2018:  There were no vitals filed for this visit.  General: The patient is well-developed and well-nourished and in no acute distress   Musculoskeletal:   She has moderate tenderness over the piriformis muscles bilaterally.  Range of motion is normal in the back.  Neurologic Exam  Mental status: The  patient is alert and oriented x 3 at the time of the examination. The patient has apparent normal recent and remote memory, with an apparently normal attention span and concentration ability.   Speech is normal.  Cranial nerves: Extraocular movements are full.  Facial strength and sensation is normal.  The trapezius strength is normal.  Her hearing is symmetric.  Motor:  Muscle bulk is normal.  Muscle tone and muscle strength are normal.  Sensory:   She had intact sensation to touch in the arms and legs.  Coordination: Cerebellar testing reveals good finger-nose-finger and heel-to-shin bilaterally.  Gait and station: Station is normal.  Gait and tandem gait are normal.  Reflexes: Deep tendon reflexes are symmetric and normal bilaterally.        DIAGNOSTIC DATA (LABS, IMAGING, TESTING) - I reviewed patient records, labs, notes, testing and imaging myself where available.  Lab Results  Component Value Date   WBC 10.3 05/03/2013   HGB 12.3 05/03/2013   HCT 36.5 05/03/2013   MCV 91.7 05/03/2013   PLT 200 05/03/2013      Component Value Date/Time   NA 137 05/03/2013 1358   K 4.4 05/03/2013 1358   CL 96 05/03/2013 1358   CO2 28 05/03/2013 1358   GLUCOSE 111 (H) 05/03/2013 1358   BUN 11 05/03/2013 1358   CREATININE 0.69 05/03/2013 1358   CALCIUM 9.8 05/03/2013 1358   PROT 7.3 05/03/2013 1358   ALBUMIN 4.1 05/03/2013 1358   AST 21 05/03/2013 1358   ALT 13 05/03/2013 1358   ALKPHOS 82 05/03/2013 1358   BILITOT 0.6 05/03/2013 1358   GFRNONAA >90 05/03/2013 1358   GFRAA >90 05/03/2013 1358       ASSESSMENT AND PLAN    1. Nonintractable juvenile myoclonic epilepsy without status epilepticus ([place])   2. Narcolepsy and cataplexy   3. Attention deficit disorder, unspecified hyperactivity presence   4. Migraine without aura and without status migrainosus, not intractable   5. Bilateral sciatica      A. Felecia Shelling, MD, PhD 63/0/1601, 09:32 AM Certified in Neurology,  Clinical Neurophysiology, Sleep Medicine, Pain Medicine and Neuroimaging  [facility] [address], [place], [place] [address] [phone]

## 2019-05-03 ENCOUNTER — Other Ambulatory Visit: Payer: Self-pay | Admitting: Neurology

## 2019-05-03 MED ORDER — AMPHETAMINE-DEXTROAMPHETAMINE 30 MG PO TABS: 30.0000 mg | ORAL_TABLET | Freq: Three times a day (TID) | ORAL | 0 refills | Status: DC

## 2019-05-03 NOTE — Addendum Note (Signed)
Addended by: Arther Abbott on: 05/03/2019 03:02 PM   Modules accepted: Orders

## 2019-05-03 NOTE — Telephone Encounter (Signed)
Pt has called for a refill on her  amphetamine-dextroamphetamine (ADDERALL) 30 MG tablet  [facility] [phone]

## 2019-06-09 ENCOUNTER — Encounter: Payer: Self-pay | Admitting: Neurology

## 2019-06-09 ENCOUNTER — Telehealth (INDEPENDENT_AMBULATORY_CARE_PROVIDER_SITE_OTHER): Payer: No Typology Code available for payment source | Admitting: Neurology

## 2019-06-09 ENCOUNTER — Telehealth: Payer: Self-pay | Admitting: *Deleted

## 2019-06-09 DIAGNOSIS — F988 Other specified behavioral and emotional disorders with onset usually occurring in childhood and adolescence: Secondary | ICD-10-CM

## 2019-06-09 DIAGNOSIS — G43009 Migraine without aura, not intractable, without status migrainosus: Secondary | ICD-10-CM | POA: Diagnosis not present

## 2019-06-09 DIAGNOSIS — G47411 Narcolepsy with cataplexy: Secondary | ICD-10-CM

## 2019-06-09 DIAGNOSIS — G40B09 Juvenile myoclonic epilepsy, not intractable, without status epilepticus: Secondary | ICD-10-CM

## 2019-06-09 MED ORDER — AMPHETAMINE-DEXTROAMPHETAMINE 30 MG PO TABS: 30.0000 mg | ORAL_TABLET | Freq: Three times a day (TID) | ORAL | 0 refills | Status: DC

## 2019-06-09 MED ORDER — SUMATRIPTAN SUCCINATE 100 MG PO TABS: ORAL_TABLET | ORAL | 3 refills | Status: DC

## 2019-06-09 MED ORDER — FLUOXETINE HCL 40 MG PO CAPS: 40.0000 mg | ORAL_CAPSULE | Freq: Every day | ORAL | 4 refills | Status: DC

## 2019-06-09 MED ORDER — PROTRIPTYLINE HCL 10 MG PO TABS: 10.0000 mg | ORAL_TABLET | Freq: Every day | ORAL | 4 refills | Status: DC

## 2019-06-09 MED ORDER — LAMOTRIGINE 200 MG PO TABS: 200.0000 mg | ORAL_TABLET | Freq: Two times a day (BID) | ORAL | 4 refills | Status: DC

## 2019-06-09 NOTE — Progress Notes (Signed)
[facility]  PATIENT: Abigail Lowery DOB: 1985/08/25   _________________________________   HISTORICAL  CHIEF COMPLAINT:  Chief Complaint  Patient presents with  . Seizures  . Other    HISTORY OF PRESENT ILLNESS:  Abigail Lowery is a 34 y.o. woman with juvenile myoclonic epilepsy , narcolepsy, migraine headaches and sciatica.     Update 06/09/2018: Virtual Visit via Video Note I connected with Abigail Lowery  on 06/09/19 at  3:00 PM EDT by a video enabled telemedicine application and verified that I am speaking with the correct person.  I discussed the limitations of evaluation and management by telemedicine and the availability of in person appointments. The patient expressed understanding and agreed to proceed.  History of Present Illness: She feels her narcolepsy is reasonably well controlled on Adderall and protriptyline. She has shifted her nights to 2 am to 10-11 am.  She is getting about 7-8 hours a night   She is staying awake and laer twith her medications.    The protriptyline has helped her cataplexy and this occurs rarely (once every 2-3 moths) now.    Only one episode of sleep paralysis a year now,   Dreams are vivid but less so than many years ago.  The Adderall also helps her ADD.  She denies any seizures and no one has commented on staring spells.   She tolerates the lamotrigine.      She notes some sciatica/piriformis pain.  These improved with piriformis injections.  Last one about 8-9 months ago.       Migraines have done well.   She takes sumatriptan if the migraine is more severe.   Observations/Objective  She is a well-developed well-nourished woman in no acute distress.  The head is normocephalic and atraumatic.  Sclera are anicteric.  Visible skin appears normal.  The neck has a good range of motion.  Pharynx and tongue have normal appearance.  She is alert and fully oriented with fluent speech and good attention, knowledge and memory.   Extraocular muscles are intact.  Facial strength is normal.  Palatal elevation and tongue protrusion are midline.  She appears to have normal strength in the arms.  Rapid alternating movements and finger-nose-finger are performed well.  Assessment and Plan: Nonintractable juvenile myoclonic epilepsy without status epilepticus (HCC)  Migraine without aura and without status migrainosus, not intractable  Narcolepsy and cataplexy  Attention deficit disorder, unspecified hyperactivity presence  1.  She will continue Adderall for narcolepsy related sleepiness and ADD.  Also continue protriptyline for cataplexy. 2.   Continue lamotrigine for JME. 3.   Migraines continue to do well with the protriptyline.  If migraine occurs she can take OTC meds or sumatriptan. 4.   Continue to try to stretch for piriformis syndrome.  She does have an active job.  If sciatica worsens and she is in town we can do another trigger point injection .5.   Return 5-6 months or sooner if there are new or worsening neurologic symptoms.   Follow Up Instructions: I discussed the assessment and treatment plan with the patient. The patient was provided an opportunity to ask questions and all were answered. The patient agreed with the plan and demonstrated an understanding of the instructions.    The patient was advised to call back or seek an in-person evaluation if the symptoms worsen or if the condition fails to improve as anticipated.  I provided 25 minutes of non-face-to-face time during this encounter.   From 12/26/2016:  JME:    She denies any recent seizures. She is on lamotrigine and tolerates it well. She is stable on current med's and has not had any staring spells since 2015-2016.   No GTC since 2008-2009.       She has not had any more morning myoclonus on current dose of lamotrigine.     Seizure history:    She was diagnosed with JME around age 82. 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 Similar  staring spells occurred and she had an EEG with a pattern consistent with juvenile myoclonic epilepsy. In the omornings, she would often have mild clonus. She still has mild clonus at times in the morning. At age 83, she had a single convulsive seizure. Initially, she was placed on Depakote but due to some breakthrough staring spells and poor tolerability, she was switched to Lamictal.   Narcolepsy/ADD:    She notes sleepiness but feels that Adderall has controlled it for the most part. She takes Adderall for 30 mg 3 times a day though she will sometimes skips the third dose if she does not need to be focused and awake later in the day. Since starting protriptyline, she has not had any cataplexy and sleep paralysis is very rare, only once a year now . She rarely takes a nap.   On weekdays, she sleeps only 6 hours some weekday and sleeps 12 hours on weekends .    Narcolepsy History:  She was diagnosed with narcolepsy in 2008 after presenting with severe sleepiness. She had PSG and MSLT in 2008 showing severe hypersomnia and sleep onset REM consistent with narcolepsy. She also had mild OSA on that initial sleep study.   A repeat PSG 06/28/2012 showed no significant OSA (AHI equals 0.2). She did have some snoring. She also had moderate periodic limb movements of sleep with an index of 25.3 and an arousal index of 11.9.. She was placed on stimulants and is currently on Adderall 30 mg 3 times a day. She tolerates this dose well. She feels her hypersomnia does well once she is awake.   She sets 3 alarms because she has trouble getting up many mornings. She gets very rare episodes of cataplexy.   Sleep paralysis events have been helped by protriptyline.[name] She will have hypnagogic hallucinations at times.  Sciatica/Back pain:   She received 3 or 4 months of benefit from the last piriformis trigger point injection but her pain has now returned. Present it is in the lower back and buttocks and occasionally will radiate  into the toes on the left. She is very active and some movements or exercises will increase the pain.       MRI of the lumbar spine around 2013 was normal.        Migraine Headache:  She experiences headaches about 3 or 4 days a month, usually around the time of her period. When she gets a headache she will have pounding pain, photophobia, phonophobia and nausea but no vomiting. Moving makes the headache worse. Ibuprofen combined with sumatriptan has helped her the most.   Mood:   The combination of Prozac and Latuda has helped her Biopolar 2 and depression.         REVIEW OF SYSTEMS: Constitutional: No fevers, chills, sweats, or change in appetite Eyes: No visual changes, double vision, eye pain Ear, nose and throat: No hearing loss, ear pain, nasal congestion, sore throat Cardiovascular: No chest pain, palpitations Respiratory: No shortness of breath  at rest or with exertion.   No wheezes GastrointestinaI: No nausea, vomiting, diarrhea, abdominal pain, fecal incontinence Genitourinary: No dysuria, urinary retention or frequency.  No nocturia. Musculoskeletal: No neck pain.   Back pain as above Integumentary: No rash, pruritus, skin lesions Neurological: as above Psychiatric: No depression at this time.  No anxiety Endocrine: No palpitations, diaphoresis, change in appetite, change in weigh or increased thirst Hematologic/Lymphatic: No anemia, purpura, petechiae. Allergic/Immunologic: No itchy/runny eyes, nasal congestion, recent allergic reactions, rashes  ALLERGIES: Allergies  Allergen Reactions  . Dust Mite Extract Itching  . Mold Extract [Trichophyton] Itching    HOME MEDICATIONS:  Current Outpatient Medications:  .  amphetamine-dextroamphetamine (ADDERALL) 30 MG tablet, Take 1 tablet by mouth 3 (three) times daily., Disp: 90 tablet, Rfl: 0 .  FLUoxetine (PROZAC) 40 MG capsule, Take 1 capsule (40 mg total) by mouth daily., Disp: 90 capsule, Rfl: 4 .  lamoTRIgine (LAMICTAL)  200 MG tablet, Take 1 tablet (200 mg total) by mouth 2 (two) times daily., Disp: 180 tablet, Rfl: 4 .  LORazepam (ATIVAN) 0.5 MG tablet, Take 1 tablet (0.5 mg total) by mouth every 8 (eight) hours as needed for anxiety., Disp: 90 tablet, Rfl: 0 .  promethazine (PHENERGAN) 25 MG tablet, Take 1 tablet (25 mg total) by mouth every 6 (six) hours as needed for nausea., Disp: 30 tablet, Rfl: 1 .  protriptyline (VIVACTIL) 10 MG tablet, Take 1 tablet (10 mg total) by mouth at bedtime., Disp: 90 tablet, Rfl: 4 .  SUMAtriptan (IMITREX) 100 MG tablet, Take one po prn headache.   May repeat in 2 hours if headache persists or recurs., Disp: 30 tablet, Rfl: 3  PAST MEDICAL HISTORY: Past Medical History:  Diagnosis Date  . Anxiety   . Colitis 2003  . Depression   . Epilepsy (HCC)   . Headache(784.0)   . Hyperlipidemia   . Migraines   . Narcolepsy   . Narcolepsy   . Seizures (HCC)    Petit mal    PAST SURGICAL HISTORY: Past Surgical History:  Procedure Laterality Date  . ADENOIDECTOMY  1998  . BILATERAL HIP ARTHROSCOPY  2011, 2012  . WRIST SURGERY Right 2004    FAMILY HISTORY: Family History  Problem Relation Age of Onset  . Depression Mother   . Anxiety disorder Mother   . Depression Father   . Anxiety disorder Maternal Aunt   . Depression Maternal Aunt   . Bipolar disorder Maternal Uncle   . Schizophrenia Maternal Grandfather   . Bipolar disorder Cousin     SOCIAL HISTORY:  Social History   Socioeconomic History  . Marital status: Single    Spouse name: Not on file  . Number of children: Not on file  . Years of education: Not on file  . Highest education level: Not on file  Occupational History  . Not on file  Tobacco Use  . Smoking status: Never Smoker  . Smokeless tobacco: Never Used  Substance and Sexual Activity  . Alcohol use: Yes    Alcohol/week: 1.0 standard drinks    Types: 1 Glasses of wine per week    Comment: Rare  . Drug use: No  . Sexual activity: Never    Other Topics Concern  . Not on file  Social History Narrative  . Not on file   Social Determinants of Health   Financial Resource Strain:   . Difficulty of Paying Living Expenses:   Food Insecurity:   . Worried About [employer] of  Food in the Last Year:   . Ran Out of Food in the Last Year:   Transportation Needs:   . Freight forwarder (Medical):   [name] Lack of Transportation (Non-Medical):   Physical Activity:   . Days of Exercise per Week:   . Minutes of Exercise per Session:   Stress:   . Feeling of Stress :   Social Connections:   . Frequency of Communication with Friends and Family:   . Frequency of Social Gatherings with Friends and Family:   . Attends Religious Services:   . Active Member of Clubs or Organizations:   . Attends Banker Meetings:   [name] Marital Status:   Intimate Partner Violence:   . Fear of Current or Ex-Partner:   . Emotionally Abused:   [name] Physically Abused:   . Sexually Abused:      PHYSICAL EXAM from 10/06/2018:  There were no vitals filed for this visit.  General: The patient is well-developed and well-nourished and in no acute distress   Musculoskeletal:   She has moderate tenderness over the piriformis muscles bilaterally.  Range of motion is normal in the back.  Neurologic Exam  Mental status: The patient is alert and oriented x 3 at the time of the examination. The patient has apparent normal recent and remote memory, with an apparently normal attention span and concentration ability.   Speech is normal.  Cranial nerves: Extraocular movements are full.  Facial strength and sensation is normal.  The trapezius strength is normal.  Her hearing is symmetric.  Motor:  Muscle bulk is normal.  Muscle tone and muscle strength are normal.  Sensory:   She had intact sensation to touch in the arms and legs.  Coordination: Cerebellar testing reveals good finger-nose-finger and heel-to-shin bilaterally.  Gait and station: Station  is normal.  Gait and tandem gait are normal.  Reflexes: Deep tendon reflexes are symmetric and normal bilaterally.        DIAGNOSTIC DATA (LABS, IMAGING, TESTING) - I reviewed patient records, labs, notes, testing and imaging myself where available.  Lab Results  Component Value Date   WBC 10.3 05/03/2013   HGB 12.3 05/03/2013   HCT 36.5 05/03/2013   MCV 91.7 05/03/2013   PLT 200 05/03/2013      Component Value Date/Time   NA 137 05/03/2013 1358   K 4.4 05/03/2013 1358   CL 96 05/03/2013 1358   CO2 28 05/03/2013 1358   GLUCOSE 111 (H) 05/03/2013 1358   BUN 11 05/03/2013 1358   CREATININE 0.69 05/03/2013 1358   CALCIUM 9.8 05/03/2013 1358   PROT 7.3 05/03/2013 1358   ALBUMIN 4.1 05/03/2013 1358   AST 21 05/03/2013 1358   ALT 13 05/03/2013 1358   ALKPHOS 82 05/03/2013 1358   BILITOT 0.6 05/03/2013 1358   GFRNONAA >90 05/03/2013 1358   GFRAA >90 05/03/2013 1358       ASSESSMENT AND PLAN    1. Nonintractable juvenile myoclonic epilepsy without status epilepticus (HCC)   2. Migraine without aura and without status migrainosus, not intractable   3. Narcolepsy and cataplexy   4. Attention deficit disorder, unspecified hyperactivity presence     Joziyah Roblero A. Epimenio Foot, MD, PhD 06/09/2019, 3:28 PM Certified in Neurology, Clinical Neurophysiology, Sleep Medicine, Pain Medicine and Neuroimaging  [facility] [address], [address] [place], [place] [address] [phone]

## 2019-06-09 NOTE — Telephone Encounter (Signed)
Called pt, updated med list, pharmacy, allergies on file for VV at 3pm with Dr. Epimenio Foot today.

## 2019-06-10 ENCOUNTER — Telehealth: Payer: Self-pay | Admitting: Neurology

## 2019-06-10 NOTE — Telephone Encounter (Signed)
Called patient and LVM to schedule a 6 month follow-up. FYI

## 2019-06-10 NOTE — Telephone Encounter (Signed)
Thank you, please try to contact again next week to schedule follow up 

## 2019-06-10 NOTE — Telephone Encounter (Signed)
-----   Message from Arther Abbott, RN sent at 06/09/2019  3:39 PM EDT ----- Abigail Lowery,  Please call Abigail Lowery 1986 and schedule a 6 month f/u. She had VV today.  Thanks, Kara Mead ----- Message ----- From: Asa Lente, MD Sent: 06/09/2019   3:32 PM EDT To: Arther Abbott, RN  Please schedule follow-up in 6 months.

## 2019-06-15 NOTE — Telephone Encounter (Signed)
Please try calling once more tomorrow if possible. After third attempt, we can done the task. Thank you!

## 2019-06-15 NOTE — Telephone Encounter (Signed)
LVM to schedule a 6 month f/u- FYI

## 2019-08-05 ENCOUNTER — Other Ambulatory Visit: Payer: Self-pay | Admitting: Neurology

## 2019-08-05 MED ORDER — AMPHETAMINE-DEXTROAMPHETAMINE 30 MG PO TABS: 30.0000 mg | ORAL_TABLET | Freq: Three times a day (TID) | ORAL | 0 refills | Status: DC

## 2019-08-05 NOTE — Telephone Encounter (Signed)
1) Medication(s) Requested (by name): amphetamine-dextroamphetamine (ADDERALL) 30 MG tablet   2) Pharmacy of Choice: [facility] #[id number] [place], [place] - [address] AT [facility] [address] [place] [address]  [address] [place], [place] [address]

## 2019-09-16 ENCOUNTER — Other Ambulatory Visit: Payer: Self-pay | Admitting: Neurology

## 2019-09-16 MED ORDER — AMPHETAMINE-DEXTROAMPHETAMINE 30 MG PO TABS: 30.0000 mg | ORAL_TABLET | Freq: Three times a day (TID) | ORAL | 0 refills | Status: DC

## 2019-09-16 NOTE — Telephone Encounter (Signed)
Pt is needing a refill on her amphetamine-dextroamphetamine (ADDERALL) 30 MG tablet sent to the [facility] in [place], [place]

## 2019-09-16 NOTE — Telephone Encounter (Signed)
Called [facility] at [phone]. Pt last refilled adderall 30mg  08/05/2019 #90. Last seen 06/09/19 and next f/u 12/09/19.

## 2019-11-01 ENCOUNTER — Other Ambulatory Visit: Payer: Self-pay | Admitting: Neurology

## 2019-11-01 MED ORDER — AMPHETAMINE-DEXTROAMPHETAMINE 30 MG PO TABS: 30.0000 mg | ORAL_TABLET | Freq: Three times a day (TID) | ORAL | 0 refills | Status: DC

## 2019-11-01 NOTE — Telephone Encounter (Signed)
Pt request refill amphetamine-dextroamphetamine (ADDERALL) 30 MG tablet at [facility] #[id number] - 

## 2019-11-01 NOTE — Addendum Note (Signed)
Addended by: Arther Abbott on: 11/01/2019 11:16 AM   Modules accepted: Orders

## 2019-11-01 NOTE — Telephone Encounter (Signed)
Called [facility] at [phone]. Pt last refilled rx  09/16/19. Spoke with Joni Reining. Last seen 06/09/19 and next f/u 12/09/19

## 2019-12-06 ENCOUNTER — Other Ambulatory Visit: Payer: Self-pay | Admitting: Neurology

## 2019-12-07 ENCOUNTER — Other Ambulatory Visit: Payer: Self-pay | Admitting: Neurology

## 2019-12-07 MED ORDER — AMPHETAMINE-DEXTROAMPHETAMINE 30 MG PO TABS: 30.0000 mg | ORAL_TABLET | Freq: Three times a day (TID) | ORAL | 0 refills | Status: DC

## 2019-12-07 NOTE — Telephone Encounter (Addendum)
Called [facility] in [place] at [phone]. Spoke with tech. Pt last refilled rx 11/01/19 #90.  Last seen 06/09/19 and next f/u 12/09/19 mychart VV.

## 2019-12-07 NOTE — Addendum Note (Signed)
Addended byYetta Barre, Rylea Selway L on: 12/07/2019 05:00 PM   Modules accepted: Orders

## 2019-12-07 NOTE — Telephone Encounter (Signed)
Pt is requesting a refill for amphetamine-dextroamphetamine (ADDERALL) 30 MG tablet.  Pharmacy:  [id number]   

## 2019-12-09 ENCOUNTER — Telehealth (INDEPENDENT_AMBULATORY_CARE_PROVIDER_SITE_OTHER): Payer: No Typology Code available for payment source | Admitting: Neurology

## 2019-12-09 ENCOUNTER — Encounter: Payer: Self-pay | Admitting: Neurology

## 2019-12-09 DIAGNOSIS — G47411 Narcolepsy with cataplexy: Secondary | ICD-10-CM | POA: Diagnosis not present

## 2019-12-09 DIAGNOSIS — G40B09 Juvenile myoclonic epilepsy, not intractable, without status epilepticus: Secondary | ICD-10-CM

## 2019-12-09 DIAGNOSIS — G43009 Migraine without aura, not intractable, without status migrainosus: Secondary | ICD-10-CM

## 2019-12-09 MED ORDER — LAMOTRIGINE 200 MG PO TABS
200.0000 mg | ORAL_TABLET | Freq: Two times a day (BID) | ORAL | 4 refills | Status: DC
Start: 2019-12-09 — End: 2020-09-21

## 2019-12-09 NOTE — Progress Notes (Signed)
[facility]  PATIENT: Abigail Lowery DOB: [birth year]   _________________________________   HISTORICAL  CHIEF COMPLAINT:  No chief complaint on file.   HISTORY OF PRESENT ILLNESS:  Abigail Lowery is a 34 y.o. woman with juvenile myoclonic epilepsy , narcolepsy, migraine headaches and sciatica.     Update 12/09/2019: Virtual Visit via Video Note I connected with [name]  on 12/09/19 at  3:00 PM EDT by a video enabled telemedicine application and verified that I am speaking with the correct person.  I discussed the limitations of evaluation and management by telemedicine and the availability of in person appointments. The patient expressed understanding and agreed to proceed.     Patient was home.  Provider in office.    History of Present Illness: She has JME diagnosed as a teenager.  She had a seizure in July - first GTC since 2009.  Of note she was sleep deprived and missed a couple doses of lamotrigine.   She also felt she was dehydrated.      She feels her narcolepsy is reasonably well controlled on Adderall 30 mg po tid and protriptyline. She has shifted her nights to 2 am to 10-11 am.  She does not feel Adderall affects her sleep onset.  She is getting about 7-8 hours a night   She is staying awake and laer twith her medications.    The protriptyline has helped her cataplexy and this occurs rarely (couiple times a year only) now.    No recent sleep paralysis.   Dreams are vivid but less so than many years ago.    The Adderall also helps her ADD.  Her migraines are occurring occasionally and sumatriptan usually helps    History: Seizure history:    She was diagnosed with JME around age 32. 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 Similar staring spells occurred and she had an EEG with a pattern consistent with juvenile myoclonic epilepsy. In the omornings, she would often have mild clonus. She still has mild clonus at times in the  morning. At age 61, she had a single convulsive seizure. Initially, she was placed on Depakote but due to some breakthrough staring spells and poor tolerability, she was switched to Lamictal.   Narcolepsy History:  She was diagnosed with narcolepsy in 2008 after presenting with severe sleepiness. She had PSG and MSLT in 2008 showing severe hypersomnia and sleep onset REM consistent with narcolepsy. She also had mild OSA on that initial sleep study.   A repeat PSG 06/28/2012 showed no significant OSA (AHI equals 0.2). She did have some snoring. She also had moderate periodic limb movements of sleep with an index of 25.3 and an arousal index of 11.9.. She was placed on stimulants and is currently on Adderall 30 mg 3 times a day. She tolerates this dose well. She feels her hypersomnia does well once she is awake.   She sets 3 alarms because she has trouble getting up many mornings. She gets very rare episodes of cataplexy.   Sleep paralysis events have been helped by protriptyline.[name] She will have hypnagogic hallucinations at times.    Observations/Objective  She is a well-developed well-nourished woman in no acute distress.  The head is normocephalic and atraumatic.  Sclera are anicteric.  Visible skin appears normal.  The neck has a good range of motion.     She is alert and fully oriented with fluent speech and good attention, knowledge and memory.  Extraocular muscles are intact.  Facial strength is normal.  Rapid alternating movements and finger-nose-finger are performed well.  Assessment and Plan: Nonintractable juvenile myoclonic epilepsy without status epilepticus (HCC)  Narcolepsy and cataplexy  Migraine without aura and without status migrainosus, not intractable  1.  Continue Adderall for narcolepsy related sleepiness and ADD.  Also continue protriptyline for cataplexy. 2.   Continue lamotrigine for JME.  We discussed compliance and adequate sleep 3.   Migraines continue to do well with the  protriptyline and sumatriptan. 4.   Return 5-6 months or sooner if there are new or worsening neurologic symptoms.   Follow Up Instructions: I discussed the assessment and treatment plan with the patient. The patient was provided an opportunity to ask questions and all were answered. The patient agreed with the plan and demonstrated an understanding of the instructions.    The patient was advised to call back or seek an in-person evaluation if the symptoms worsen or if the condition fails to improve as anticipated.  I provided 20 minutes of non-face-to-face time during this encounter.  REVIEW OF SYSTEMS: Constitutional: No fevers, chills, sweats, or change in appetite Eyes: No visual changes, double vision, eye pain Ear, nose and throat: No hearing loss, ear pain, nasal congestion, sore throat Cardiovascular: No chest pain, palpitations Respiratory: No shortness of breath at rest or with exertion.   No wheezes GastrointestinaI: No nausea, vomiting, diarrhea, abdominal pain, fecal incontinence Genitourinary: No dysuria, urinary retention or frequency.  No nocturia. Musculoskeletal: No neck pain.   Back pain as above Integumentary: No rash, pruritus, skin lesions Neurological: as above Psychiatric: No depression at this time.  No anxiety Endocrine: No palpitations, diaphoresis, change in appetite, change in weigh or increased thirst Hematologic/Lymphatic: No anemia, purpura, petechiae. Allergic/Immunologic: No itchy/runny eyes, nasal congestion, recent allergic reactions, rashes  ALLERGIES: Allergies  Allergen Reactions   Dust Mite Extract Itching   Mold Extract [Trichophyton] Itching    HOME MEDICATIONS:  Current Outpatient Medications:    amphetamine-dextroamphetamine (ADDERALL) 30 MG tablet, Take 1 tablet by mouth 3 (three) times daily., Disp: 90 tablet, Rfl: 0   FLUoxetine (PROZAC) 40 MG capsule, Take 1 capsule (40 mg total) by mouth daily., Disp: 90 capsule, Rfl: 4    lamoTRIgine (LAMICTAL) 200 MG tablet, Take 1 tablet (200 mg total) by mouth 2 (two) times daily., Disp: 180 tablet, Rfl: 4   LORazepam (ATIVAN) 0.5 MG tablet, Take 1 tablet (0.5 mg total) by mouth every 8 (eight) hours as needed for anxiety., Disp: 90 tablet, Rfl: 0   promethazine (PHENERGAN) 25 MG tablet, Take 1 tablet (25 mg total) by mouth every 6 (six) hours as needed for nausea., Disp: 30 tablet, Rfl: 1   protriptyline (VIVACTIL) 10 MG tablet, Take 1 tablet (10 mg total) by mouth at bedtime., Disp: 90 tablet, Rfl: 4   SUMAtriptan (IMITREX) 100 MG tablet, Take one po prn headache.   May repeat in 2 hours if headache persists or recurs., Disp: 30 tablet, Rfl: 3  PAST MEDICAL HISTORY: Past Medical History:  Diagnosis Date   Anxiety    Colitis 2003   Depression    Epilepsy (HCC)    Headache(784.0)    Hyperlipidemia    Migraines    Narcolepsy    Narcolepsy    Seizures (HCC)    Petit mal    PAST SURGICAL HISTORY: Past Surgical History:  Procedure Laterality Date   ADENOIDECTOMY  1998   BILATERAL HIP ARTHROSCOPY  2011, 2012   WRIST  SURGERY Right 2004    FAMILY HISTORY: Family History  Problem Relation Age of Onset   Depression Mother    Anxiety disorder Mother    Depression Father    Anxiety disorder Maternal Aunt    Depression Maternal Aunt    Bipolar disorder Maternal Uncle    Schizophrenia Maternal Grandfather    Bipolar disorder Cousin     Ryatt Corsino A. Epimenio Foot, MD, PhD 12/09/2019, 3:22 PM Certified in Neurology, Clinical Neurophysiology, Sleep Medicine, Pain Medicine and Neuroimaging  [facility] [address] [place], [place] [address] [phone]

## 2020-01-20 ENCOUNTER — Other Ambulatory Visit: Payer: Self-pay | Admitting: Neurology

## 2020-01-20 MED ORDER — AMPHETAMINE-DEXTROAMPHETAMINE 30 MG PO TABS
30.0000 mg | ORAL_TABLET | Freq: Three times a day (TID) | ORAL | 0 refills | Status: DC
Start: 2020-01-20 — End: 2020-03-10

## 2020-01-20 NOTE — Telephone Encounter (Signed)
Pt request refill amphetamine-dextroamphetamine (ADDERALL) 30 MG tablet at [id number]

## 2020-03-10 ENCOUNTER — Other Ambulatory Visit: Payer: Self-pay | Admitting: Neurology

## 2020-03-10 MED ORDER — AMPHETAMINE-DEXTROAMPHETAMINE 30 MG PO TABS
30.0000 mg | ORAL_TABLET | Freq: Three times a day (TID) | ORAL | 0 refills | Status: DC
Start: 2020-03-10 — End: 2020-05-01

## 2020-03-10 NOTE — Addendum Note (Signed)
Addended by: Lindell Spar C on: 03/10/2020 11:59 AM   Modules accepted: Orders

## 2020-03-10 NOTE — Telephone Encounter (Signed)
Pt. requests a refill for amphetamine-dextroamphetamine (ADDERALL) 30 MG tablet.  Pharmacy: [facility] [phone]

## 2020-05-01 ENCOUNTER — Other Ambulatory Visit: Payer: Self-pay | Admitting: Neurology

## 2020-05-01 MED ORDER — AMPHETAMINE-DEXTROAMPHETAMINE 30 MG PO TABS
30.0000 mg | ORAL_TABLET | Freq: Three times a day (TID) | ORAL | 0 refills | Status: DC
Start: 2020-05-01 — End: 2020-06-22

## 2020-05-01 NOTE — Telephone Encounter (Signed)
Pt request refill amphetamine-dextroamphetamine (ADDERALL) 30 MG tablet at [facility] #[id number] -

## 2020-05-31 ENCOUNTER — Telehealth: Payer: Self-pay | Admitting: Neurology

## 2020-05-31 NOTE — Telephone Encounter (Signed)
LVM for pt to call back to r/s Mychart visit on 4/7 due to Dr. Epimenio Foot out. There are spots during the week held for this.

## 2020-06-08 ENCOUNTER — Telehealth: Payer: No Typology Code available for payment source | Admitting: Neurology

## 2020-06-22 ENCOUNTER — Other Ambulatory Visit: Payer: Self-pay | Admitting: Neurology

## 2020-06-22 MED ORDER — AMPHETAMINE-DEXTROAMPHETAMINE 30 MG PO TABS: 30.0000 mg | ORAL_TABLET | Freq: Three times a day (TID) | ORAL | 0 refills | Status: DC

## 2020-06-22 NOTE — Telephone Encounter (Signed)
Called [facility] at [phone]. Spoke w/ tech. Pt last refilled rx adderall 05/02/2020. Patient last seen 12/09/19 and next follow up 09/21/20.

## 2020-06-22 NOTE — Telephone Encounter (Signed)
Pt is needing a refill on her amphetamine-dextroamphetamine (ADDERALL) 30 MG tablet sent in to the [facility] on [address]

## 2020-08-03 ENCOUNTER — Other Ambulatory Visit: Payer: Self-pay | Admitting: Neurology

## 2020-08-03 NOTE — Telephone Encounter (Signed)
Pt is requesting a refill for amphetamine-dextroamphetamine (ADDERALL) 30 MG tablet.  Pharmacy:  [id number]   

## 2020-08-07 MED ORDER — AMPHETAMINE-DEXTROAMPHETAMINE 30 MG PO TABS: 30.0000 mg | ORAL_TABLET | Freq: Three times a day (TID) | ORAL | 0 refills | Status: DC

## 2020-08-07 NOTE — Addendum Note (Signed)
Addended by: Alexia Freestone R on: 08/07/2020 12:44 PM   Modules accepted: Orders

## 2020-09-15 ENCOUNTER — Other Ambulatory Visit: Payer: Self-pay | Admitting: Neurology

## 2020-09-15 NOTE — Telephone Encounter (Signed)
Pt request refill amphetamine-dextroamphetamine (ADDERALL) 30 MG tablet at [facility] [phone]

## 2020-09-18 MED ORDER — AMPHETAMINE-DEXTROAMPHETAMINE 30 MG PO TABS: 30.0000 mg | ORAL_TABLET | Freq: Three times a day (TID) | ORAL | 0 refills | Status: DC

## 2020-09-21 ENCOUNTER — Telehealth (INDEPENDENT_AMBULATORY_CARE_PROVIDER_SITE_OTHER): Payer: Self-pay | Admitting: Neurology

## 2020-09-21 ENCOUNTER — Encounter: Payer: Self-pay | Admitting: Neurology

## 2020-09-21 DIAGNOSIS — G47411 Narcolepsy with cataplexy: Secondary | ICD-10-CM

## 2020-09-21 DIAGNOSIS — F988 Other specified behavioral and emotional disorders with onset usually occurring in childhood and adolescence: Secondary | ICD-10-CM

## 2020-09-21 DIAGNOSIS — G43009 Migraine without aura, not intractable, without status migrainosus: Secondary | ICD-10-CM

## 2020-09-21 DIAGNOSIS — G40B09 Juvenile myoclonic epilepsy, not intractable, without status epilepticus: Secondary | ICD-10-CM

## 2020-09-21 MED ORDER — FLUOXETINE HCL 40 MG PO CAPS: 40.0000 mg | ORAL_CAPSULE | Freq: Every day | ORAL | 4 refills | Status: DC

## 2020-09-21 MED ORDER — PROTRIPTYLINE HCL 10 MG PO TABS: 10.0000 mg | ORAL_TABLET | Freq: Every day | ORAL | 4 refills | Status: DC

## 2020-09-21 MED ORDER — LAMOTRIGINE 200 MG PO TABS: 200.0000 mg | ORAL_TABLET | Freq: Two times a day (BID) | ORAL | 4 refills | Status: DC

## 2020-09-21 MED ORDER — SUMATRIPTAN SUCCINATE 100 MG PO TABS: ORAL_TABLET | ORAL | 3 refills | Status: DC

## 2020-09-21 NOTE — Progress Notes (Signed)
[facility]  PATIENT: Abigail Lowery DOB: 01-30-1986   _________________________________   HISTORICAL  CHIEF COMPLAINT:  No chief complaint on file.   HISTORY OF PRESENT ILLNESS:  Abigail Lowery is a 35 y.o. woman with juvenile myoclonic epilepsy , narcolepsy, migraine headaches and sciatica.     Update 12/09/2019: Virtual Visit via Video Note I connected with Abigail Lowery  on 09/21/20 at  3:00 PM EDT by a video enabled telemedicine application and verified that I am speaking with the correct person.  I discussed the limitations of evaluation and management by telemedicine and the availability of in person appointments. The patient expressed understanding and agreed to proceed.     Patient was at her home.  Provider in office.    History of Present Illness: She has JME diagnosed as a teenager.  She had a seizure in July - first GTC since 2009.  She had a grand mal seizure in 2021 while sleep  deprived and missed a couple doses of lamotrigine.   She also felt she was dehydrated a the time.   She has not missed any doses recently.   She feels her narcolepsy is reasonably well controlled on Adderall 30 mg po tid and protriptyline. She has shifted her nights to 2 am to 10-11 am.  She does not feel Adderall affects her sleep onset.  She is getting about 7-8 hours a night   She is staying awake and laer twith her medications.    The protriptyline has helped her cataplexy and no recent events.    No recent sleep paralysis spells.   Dreams are vivid but less so than many years ago.   Only rare hypnagogic hallucinations  The Adderall also helps her ADD.  She works in Print production planner.    Her migraines are occurring occasionally and sumatriptan usually helps.   She usually takes sumatriptan as soon as one is starting and they do not last long.    TPIs into piriformis muscles helped her sciatica in the past but not much pain recently.  Has had numbness  though  History: Seizure history:    She was diagnosed with JME around age 88. 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 Similar staring spells occurred and she had an EEG with a pattern consistent with juvenile myoclonic epilepsy. In the omornings, she would often have mild clonus. She still has mild clonus at times in the morning. At age 73, she had a single convulsive seizure. Initially, she was placed on Depakote but due to some breakthrough staring spells and poor tolerability, she was switched to Lamictal.   Narcolepsy History:  She was diagnosed with narcolepsy in 2008 after presenting with severe sleepiness. She had PSG and MSLT in 2008 showing severe hypersomnia and sleep onset REM consistent with narcolepsy. She also had mild OSA on that initial sleep study.   A repeat PSG 06/28/2012 showed no significant OSA (AHI equals 0.2). She did have some snoring. She also had moderate periodic limb movements of sleep with an index of 25.3 and an arousal index of 11.9.. She was placed on stimulants and is currently on Adderall 30 mg 3 times a day. She tolerates this dose well. She feels her hypersomnia does well once she is awake.   She sets 3 alarms because she has trouble getting up many mornings. She gets very rare episodes of cataplexy.   Sleep paralysis events have been helped by protriptyline.[name] She will have hypnagogic hallucinations  at times.    Observations/Objective  She is a well-developed well-nourished woman in no acute distress.  The head is normocephalic and atraumatic.  Sclera are anicteric.  Visible skin appears normal.  The neck has a good range of motion.     She is alert and fully oriented with fluent speech and good attention, knowledge and memory.  Extraocular muscles are intact.  Facial strength appears normal  Rapid alternating movements and finger-nose-finger are performed well.  Assessment and Plan: Nonintractable juvenile myoclonic epilepsy without status  epilepticus (HCC)  Narcolepsy and cataplexy  Migraine without aura and without status migrainosus, not intractable  Attention deficit disorder, unspecified hyperactivity presence  1.  Continue Adderall for narcolepsy related sleepiness and ADD.  Protriptyline for cataplexy. 2.   Continue lamotrigine 200 mg po bid for JME.  We discussed compliance and adequate sleep 3.   Migraines continue to do well with the protriptyline and sumatriptan. 4.   Stay active and exercise as tolerated.  5.   Return 6 months or sooner if there are new or worsening neurologic symptoms.   Follow Up Instructions: I discussed the assessment and treatment plan with the patient. The patient was provided an opportunity to ask questions and all were answered. The patient agreed with the plan and demonstrated an understanding of the instructions.    The patient was advised to call back or seek an in-person evaluation if the symptoms worsen or if the condition fails to improve as anticipated.  I provided 15 minutes of non-face-to-face time during this encounter.  REVIEW OF SYSTEMS: Constitutional: No fevers, chills, sweats, or change in appetite Eyes: No visual changes, double vision, eye pain Ear, nose and throat: No hearing loss, ear pain, nasal congestion, sore throat Cardiovascular: No chest pain, palpitations Respiratory:  No shortness of breath at rest or with exertion.   No wheezes GastrointestinaI: No nausea, vomiting, diarrhea, abdominal pain, fecal incontinence Genitourinary:  No dysuria, urinary retention or frequency.  No nocturia. Musculoskeletal:  No neck pain.   Back pain as above Integumentary: No rash, pruritus, skin lesions Neurological: as above Psychiatric: No depression at this time.  No anxiety Endocrine: No palpitations, diaphoresis, change in appetite, change in weigh or increased thirst Hematologic/Lymphatic:  No anemia, purpura, petechiae. Allergic/Immunologic: No itchy/runny eyes, nasal  congestion, recent allergic reactions, rashes  ALLERGIES: Allergies  Allergen Reactions   Dust Mite Extract Itching   Mold Extract [Trichophyton] Itching    HOME MEDICATIONS:  Current Outpatient Medications:    amphetamine-dextroamphetamine (ADDERALL) 30 MG tablet, Take 1 tablet by mouth 3 (three) times daily., Disp: 90 tablet, Rfl: 0   FLUoxetine (PROZAC) 40 MG capsule, Take 1 capsule (40 mg total) by mouth daily., Disp: 90 capsule, Rfl: 4   lamoTRIgine (LAMICTAL) 200 MG tablet, Take 1 tablet (200 mg total) by mouth 2 (two) times daily., Disp: 180 tablet, Rfl: 4   LORazepam (ATIVAN) 0.5 MG tablet, Take 1 tablet (0.5 mg total) by mouth every 8 (eight) hours as needed for anxiety., Disp: 90 tablet, Rfl: 0   promethazine (PHENERGAN) 25 MG tablet, Take 1 tablet (25 mg total) by mouth every 6 (six) hours as needed for nausea., Disp: 30 tablet, Rfl: 1   protriptyline (VIVACTIL) 10 MG tablet, Take 1 tablet (10 mg total) by mouth at bedtime., Disp: 90 tablet, Rfl: 4   SUMAtriptan (IMITREX) 100 MG tablet, Take one po prn headache.   May repeat in 2 hours if headache persists or recurs., Disp: 30 tablet, Rfl: 3  PAST MEDICAL HISTORY: Past Medical History:  Diagnosis Date   Anxiety    Colitis 2003   Depression    Epilepsy (HCC)    Headache(784.0)    Hyperlipidemia    Migraines    Narcolepsy    Narcolepsy    Seizures (HCC)    Petit mal    PAST SURGICAL HISTORY: Past Surgical History:  Procedure Laterality Date   ADENOIDECTOMY  1998   BILATERAL HIP ARTHROSCOPY  2011, 2012   WRIST SURGERY Right 2004    FAMILY HISTORY: Family History  Problem Relation Age of Onset   Depression Mother    Anxiety disorder Mother    Depression Father    Anxiety disorder Maternal Aunt    Depression Maternal Aunt    Bipolar disorder Maternal Uncle    Schizophrenia Maternal Grandfather    Bipolar disorder Cousin     Journey Ratterman A. Epimenio Foot, MD, PhD 09/21/2020, 3:13 PM Certified in Neurology, Clinical  Neurophysiology, Sleep Medicine, Pain Medicine and Neuroimaging  [facility] [address] [place], [place] [address] [phone]

## 2020-10-24 ENCOUNTER — Other Ambulatory Visit: Payer: Self-pay | Admitting: Neurology

## 2020-10-24 NOTE — Telephone Encounter (Signed)
Pt request refill amphetamine-dextroamphetamine (ADDERALL) 30 MG tablet at [facility] #[id number] - 

## 2020-10-24 NOTE — Telephone Encounter (Signed)
[facility] #[id number] [place], [place] at [phone] . Spoke with tech. Pt last refilled Adderall 30mg  09/19/2020 #90. Pt last seen 09/21/20 and next follow up 03/29/21.

## 2020-10-25 MED ORDER — AMPHETAMINE-DEXTROAMPHETAMINE 30 MG PO TABS: 30.0000 mg | ORAL_TABLET | Freq: Three times a day (TID) | ORAL | 0 refills | Status: DC

## 2020-12-11 ENCOUNTER — Other Ambulatory Visit: Payer: Self-pay | Admitting: Neurology

## 2020-12-11 MED ORDER — AMPHETAMINE-DEXTROAMPHETAMINE 30 MG PO TABS: 30.0000 mg | ORAL_TABLET | Freq: Three times a day (TID) | ORAL | 0 refills | Status: DC

## 2020-12-11 NOTE — Telephone Encounter (Signed)
Called [facility] at [phone]. Last rx sent 10/25/20, filled 10/27/20. Last seen 09/21/20 and next follow up 03/29/21.

## 2020-12-11 NOTE — Telephone Encounter (Signed)
Pt called requesting refill for amphetamine-dextroamphetamine (ADDERALL) 30 MG tablet. Pharmacy [facility] [phone].

## 2020-12-18 ENCOUNTER — Other Ambulatory Visit: Payer: Self-pay | Admitting: Neurology

## 2020-12-18 MED ORDER — AMPHETAMINE-DEXTROAMPHETAMINE 30 MG PO TABS: 30.0000 mg | ORAL_TABLET | Freq: Three times a day (TID) | ORAL | 0 refills | Status: DC

## 2020-12-18 NOTE — Telephone Encounter (Signed)
Pt has called to report that she checked with the [facility] #[id number] and they do not have her medication in stock.  Pt is asking if there is an alternative medication that she can be put on while waiting on her amphetamine-dextroamphetamine (ADDERALL) 30 MG tablet

## 2020-12-18 NOTE — Telephone Encounter (Signed)
Pt has called to report that her pharmacy that she primarily uses has the medication in extended release form.  Pt asking if this is an option for her, please call.

## 2020-12-18 NOTE — Telephone Encounter (Signed)
Called [facility] at [phone] to see when pt last refilled adderall 30mg . Spoke w/ tech.  Asked them to cx rx on file since we are sending to alternate pharmacy. They verbalized understanding and will do this.

## 2020-12-18 NOTE — Telephone Encounter (Signed)
Pt called states her amphetamine-dextroamphetamine (ADDERALL) 30 MG tablet is on back order at the [facility], asking if it would be called into [facility] Store Number: [id number] [place], [place] [address].

## 2020-12-19 ENCOUNTER — Telehealth: Payer: Self-pay | Admitting: Neurology

## 2020-12-19 MED ORDER — AMPHETAMINE-DEXTROAMPHET ER 30 MG PO CP24: 30.0000 mg | ORAL_CAPSULE | Freq: Every day | ORAL | 0 refills | Status: DC

## 2020-12-19 NOTE — Telephone Encounter (Signed)
Called the pt to inform her that the medication has been sent for her for the change to the pharmacy requested. She was appreciative for the update

## 2020-12-19 NOTE — Telephone Encounter (Signed)
Called pt back. [facility] is out of stock for adderall 30mg  as well. Wondering if Adderall XR 30mg  can be sent to [facility] instead. Advised I will send to MD to e-scribe. If ok, will send in. If not, we will call back. She verbalized understanding.   Called Adam at , he d/c'd adderall 30mg  sent 12/18/20.

## 2020-12-19 NOTE — Telephone Encounter (Signed)
Pt called wanting to know when her Adderall Rx will be called in. Please advise.

## 2020-12-19 NOTE — Telephone Encounter (Signed)
The refill has been forwarded to Dr Epimenio Foot for him to approve the change and resend to pharmacy. Will call the pt once the medication has been sent.

## 2020-12-19 NOTE — Addendum Note (Signed)
Addended by: Arther Abbott on: 12/19/2020 10:13 AM   Modules accepted: Orders

## 2021-01-17 ENCOUNTER — Other Ambulatory Visit: Payer: Self-pay | Admitting: Neurology

## 2021-01-17 MED ORDER — AMPHETAMINE-DEXTROAMPHET ER 30 MG PO CP24: 30.0000 mg | ORAL_CAPSULE | Freq: Every day | ORAL | 0 refills | Status: DC

## 2021-01-17 NOTE — Telephone Encounter (Signed)
I have routed this request to Dr Sater for review. The pt is due for the medication and [place] registry was verified.  

## 2021-01-17 NOTE — Telephone Encounter (Signed)
Pt has called for a refill on her amphetamine-dextroamphetamine (ADDERALL) 30 MG tablet to [facility] [phone] (she states the pharmacy is still out of the regular, she will need another order of the XR)

## 2021-02-05 ENCOUNTER — Other Ambulatory Visit: Payer: Self-pay | Admitting: Neurology

## 2021-02-05 MED ORDER — AMPHETAMINE-DEXTROAMPHET ER 15 MG PO CP24: 30.0000 mg | ORAL_CAPSULE | Freq: Every day | ORAL | 0 refills | Status: DC

## 2021-02-05 NOTE — Telephone Encounter (Signed)
Called and spoke w/ pt. Advised I spoke w/ MD. He recommended changing her to adderall IR 15mg  po BID (morning and at noon). She states she was previously on IR 30mg  po TID. Would like to do this instead since this is what she was on before. Aware I will call pharmacy to see if they have this in stock.  Spoke w/ pharmacist. They do not have any IR in stock. Confirmed they do not have ER 30mg .  They do have 15mg  ER in stock. Pt could take 2-15mg  to equal 30mg  as normal.

## 2021-02-05 NOTE — Telephone Encounter (Signed)
Pt is asking for a call from RN on how to go about getting her refill on TSV:[id number] (ADDERALL XR) 30 MG 24 hr capsule since all local pharmacies are out.

## 2021-03-03 ENCOUNTER — Other Ambulatory Visit: Payer: Self-pay | Admitting: Neurology

## 2021-03-09 ENCOUNTER — Other Ambulatory Visit: Payer: Self-pay | Admitting: Neurology

## 2021-03-09 MED ORDER — AMPHETAMINE-DEXTROAMPHET ER 15 MG PO CP24: 30.0000 mg | ORAL_CAPSULE | Freq: Every day | ORAL | 0 refills | Status: DC

## 2021-03-09 NOTE — Telephone Encounter (Signed)
Pt is needing a refill request for her amphetamine-dextroamphetamine (ADDERALL XR) 15 MG 24 hr capsule sent in to the [facility] on [address]

## 2021-03-09 NOTE — Telephone Encounter (Signed)
Pt has called to report that her pharmacy is out of stock of her medication, she'd like a call to discuss other options.

## 2021-03-12 ENCOUNTER — Telehealth: Payer: Self-pay | Admitting: Neurology

## 2021-03-12 ENCOUNTER — Other Ambulatory Visit: Payer: Self-pay | Admitting: *Deleted

## 2021-03-12 MED ORDER — AMPHETAMINE-DEXTROAMPHETAMINE 30 MG PO TABS: 30.0000 mg | ORAL_TABLET | Freq: Three times a day (TID) | ORAL | 0 refills | Status: DC

## 2021-03-12 NOTE — Telephone Encounter (Signed)
Called the patient back. She answered and then I was unable to hear her. Waited a minute before hanging up.   **If pt calls back please advise I will forward the refill request to Dr Epimenio Foot to [facility] she [facility].

## 2021-03-12 NOTE — Telephone Encounter (Signed)
Pt requesting refill for amphetamine-dextroamphetamine (ADDERALL) 30 MG tablet [id number] of [facility] at [facility] Would like a call from the nurse

## 2021-03-12 NOTE — Telephone Encounter (Signed)
Pt returned phone call.  Called  pt back to inform her nurse's response, "I have forwarded the refill to Dr. Epimenio Foot to send to the other pharmacy." Pt verbalize understand.

## 2021-03-12 NOTE — Addendum Note (Signed)
Addended by: Roberts Gaudy L on: 03/12/2021 09:01 AM   Modules accepted: Orders

## 2021-03-12 NOTE — Telephone Encounter (Signed)
Called pt. She would like to go back to adderall 30mg  po TID. Aware I will send to MD to e-scribe if he approves. She is aware that in future, if she goes back to 15mg  XR, she can always call around to other local pharmacies to see if they have any in stock if backordered. We could then send rx to that pharmacy.

## 2021-03-29 ENCOUNTER — Telehealth: Payer: Self-pay | Admitting: Neurology

## 2021-03-29 ENCOUNTER — Encounter: Payer: Self-pay | Admitting: Neurology

## 2021-03-29 ENCOUNTER — Telehealth (INDEPENDENT_AMBULATORY_CARE_PROVIDER_SITE_OTHER): Payer: Self-pay | Admitting: Neurology

## 2021-03-29 DIAGNOSIS — G47411 Narcolepsy with cataplexy: Secondary | ICD-10-CM

## 2021-03-29 DIAGNOSIS — M5432 Sciatica, left side: Secondary | ICD-10-CM

## 2021-03-29 DIAGNOSIS — G40B09 Juvenile myoclonic epilepsy, not intractable, without status epilepticus: Secondary | ICD-10-CM

## 2021-03-29 DIAGNOSIS — F988 Other specified behavioral and emotional disorders with onset usually occurring in childhood and adolescence: Secondary | ICD-10-CM

## 2021-03-29 DIAGNOSIS — M5431 Sciatica, right side: Secondary | ICD-10-CM

## 2021-03-29 DIAGNOSIS — G43009 Migraine without aura, not intractable, without status migrainosus: Secondary | ICD-10-CM

## 2021-03-29 MED ORDER — AMPHETAMINE-DEXTROAMPHETAMINE 30 MG PO TABS: 30.0000 mg | ORAL_TABLET | Freq: Three times a day (TID) | ORAL | 0 refills | Status: DC

## 2021-03-29 MED ORDER — FLUOXETINE HCL 40 MG PO CAPS: 40.0000 mg | ORAL_CAPSULE | Freq: Every day | ORAL | 4 refills | Status: DC

## 2021-03-29 MED ORDER — LAMOTRIGINE 200 MG PO TABS
200.0000 mg | ORAL_TABLET | Freq: Two times a day (BID) | ORAL | 3 refills | Status: DC
Start: 2021-03-29 — End: 2022-02-07

## 2021-03-29 NOTE — Progress Notes (Signed)
[facility]  PATIENT: Abigail Lowery DOB: [birth year]   _________________________________   HISTORICAL  CHIEF COMPLAINT:  No chief complaint on file.    HISTORY OF PRESENT ILLNESS:  Abigail Lowery is a 36 y.o. woman with juvenile myoclonic epilepsy , narcolepsy, migraine headaches and sciatica.     Update 12/09/2019: Virtual Visit via Video Note I connected with Abigail Lowery  on 03/29/21 at  3:00 PM EST by a video enabled telemedicine application and verified that I am speaking with the correct person.  I discussed the limitations of evaluation and management by telemedicine and the availability of in person appointments. The patient expressed understanding and agreed to proceed.     Patient was at her home.  Provider in office.    History of Present Illness: She has JME diagnosed as a teenager.  She had a seizure in July - first GTC since 2009.  She had a grand mal seizure in 2021 while sleep deprived and missed a couple doses of lamotrigine.   She also felt she was dehydrated a the time.   She has not missed any doses recently.   She feels her narcolepsy is reasonably well controlled on Adderall 30 mg po tid and protriptyline. She has shifted her nights to 2 am to 10-11 am.  She does not feel Adderall affects her sleep onset.  She is getting 7 hours a night most nights.  Sometimes difficult waking up.   She occasionally takes  a nap.   The protriptyline has greatly helped her cataplexy none in several years    Also no recent sleep paralysis spells.   Dreams are vivid but only rare hypnagogic hallucinations  The Adderall also helps her ADD.  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   She usually takes sumatriptan as soon as one is starting and they do not last long.    Sciatica type pain (likely piriformis related) still occurs.   TPI's into piriformis muscles  helped her sciatica in the past but not much pain recently.  Has had numbness though  History: Seizure history:    She was diagnosed with JME around age 77. 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 Similar staring spells occurred and she had an EEG with a pattern consistent with juvenile myoclonic epilepsy. In the omornings, she would often have mild clonus. She still has mild clonus at times in the morning. At age 8, she had a single convulsive seizure. Initially, she was placed on Depakote but due to some breakthrough staring spells and poor tolerability, she was switched to Lamictal.   Narcolepsy History:  She was diagnosed with narcolepsy in 2008 after presenting with severe sleepiness. She had PSG and MSLT in 2008 showing severe hypersomnia and sleep onset REM consistent with narcolepsy. She also had mild OSA on that initial sleep study.   A repeat PSG 06/28/2012 showed no significant OSA (AHI equals 0.2). She did have some snoring. She also had moderate periodic limb movements of sleep with an index of 25.3 and an arousal index of 11.9.. She was placed on stimulants and is currently on Adderall 30 mg 3 times a day. She tolerates this dose well. She feels her hypersomnia does well once she is awake.   She sets 3 alarms because she has trouble getting up many mornings. She gets very rare episodes of cataplexy.   Sleep  paralysis events have been helped by protriptyline.[name] She will have hypnagogic hallucinations at times.    Observations/Objective  She is a well-developed well-nourished woman in no acute distress.  The head is normocephalic and atraumatic.  Sclera are anicteric.  Visible skin appears normal.  The neck has a good range of motion.     She is alert and fully oriented with fluent speech and good attention, knowledge and memory.  Extraocular muscles are intact.  Facial strength appears normal  Rapid alternating movements and finger-nose-finger are performed well.  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  Continue Adderall 30 mg tid (some days takes bid) for narcolepsy related sleepiness and ADD.  Protriptyline for cataplexy. 2.   Continue lamotrigine 200 mg po bid for JME.  We discussed need for timely compliance and adequate sleep 3.   Migraines are doing very well with the protriptyline and sumatriptan. 4.   Stay active and exercise as tolerated.  5.   Return 6 months or sooner if there are new or worsening neurologic symptoms.   Follow Up Instructions: I discussed the assessment and treatment plan with the patient. The patient was provided an opportunity to ask questions and all were answered. The patient agreed with the plan and demonstrated an understanding of the instructions.    The patient was advised to call back or seek an in-person evaluation if the symptoms worsen or if the condition fails to improve as anticipated.  I provided 15 minutes of non-face-to-face time during this encounter.  REVIEW OF SYSTEMS: Constitutional: No fevers, chills, sweats, or change in appetite Eyes: No visual changes, double vision, eye pain Ear, nose and throat: No hearing loss, ear pain, nasal congestion, sore throat Cardiovascular: No chest pain, palpitations Respiratory:  No shortness of breath at rest or with exertion.   No wheezes GastrointestinaI: No nausea, vomiting, diarrhea, abdominal pain, fecal incontinence Genitourinary:  No dysuria, urinary retention or frequency.  No nocturia. Musculoskeletal:  No neck pain.   Back pain as above Integumentary: No rash, pruritus, skin lesions Neurological: as above Psychiatric: No depression at this time.  No anxiety Endocrine: No palpitations, diaphoresis, change in appetite,  change in weigh or increased thirst Hematologic/Lymphatic:  No anemia, purpura, petechiae. Allergic/Immunologic: No itchy/runny eyes, nasal congestion, recent allergic reactions, rashes  ALLERGIES: Allergies  Allergen Reactions   Dust Mite Extract Itching   Mold Extract [Trichophyton] Itching    HOME MEDICATIONS:  Current Outpatient Medications:    amphetamine-dextroamphetamine (ADDERALL) 30 MG tablet, Take 1 tablet by mouth 3 (three) times daily., Disp: 90 tablet, Rfl: 0   FLUoxetine (PROZAC) 40 MG capsule, Take 1 capsule (40 mg total) by mouth daily., Disp: 90 capsule, Rfl: 4   lamoTRIgine (LAMICTAL) 200 MG tablet, Take 1 tablet (200 mg total) by mouth 2 (two) times daily., Disp: 180 tablet, Rfl: 3   promethazine (PHENERGAN) 25 MG tablet, Take 1 tablet (25 mg total) by mouth every 6 (six) hours as needed for nausea., Disp: 30 tablet, Rfl: 1   protriptyline (VIVACTIL) 10 MG tablet, Take 1 tablet (10 mg total) by mouth at bedtime., Disp: 90 tablet, Rfl: 4   SUMAtriptan (IMITREX) 100 MG tablet, Take one po prn headache.   May repeat in 2 hours if headache persists or recurs., Disp: 30 tablet, Rfl: 3  PAST MEDICAL  HISTORY: Past Medical History:  Diagnosis Date   Anxiety    Colitis 2003   Depression    Epilepsy ([place])    Headache(784.0)    Hyperlipidemia    Migraines    Narcolepsy    Narcolepsy    Seizures ([place])    Petit mal    PAST SURGICAL HISTORY: Past Surgical History:  Procedure Laterality Date   ADENOIDECTOMY  1998   BILATERAL HIP ARTHROSCOPY  2011, 2012   WRIST SURGERY Right 2004    FAMILY HISTORY: Family History  Problem Relation Age of Onset   Depression Mother    Anxiety disorder Mother    Depression Father    Anxiety disorder Maternal Aunt    Depression Maternal Aunt    Bipolar disorder Maternal Uncle    Schizophrenia Maternal Grandfather    Bipolar disorder Cousin     Jordanna Hendrie A. Felecia Shelling, MD, PhD [id number], [id number] PM Certified in Neurology, Clinical  Neurophysiology, Sleep Medicine, Pain Medicine and Neuroimaging  [facility] [address] [place], [place] [address] [phone]

## 2021-03-29 NOTE — Telephone Encounter (Signed)
Dr. Epimenio Foot requested an in office visit next time with this patient. She works in [place]. Sent mychart message to see what will work for her.

## 2021-05-30 ENCOUNTER — Other Ambulatory Visit: Payer: Self-pay | Admitting: Neurology

## 2021-05-30 MED ORDER — AMPHETAMINE-DEXTROAMPHETAMINE 30 MG PO TABS: 30.0000 mg | ORAL_TABLET | Freq: Three times a day (TID) | ORAL | 0 refills | Status: DC

## 2021-05-30 NOTE — Telephone Encounter (Signed)
Pt is needing a refill request for her amphetamine-dextroamphetamine (ADDERALL) 30 MG tablet sent to the [facility] off [address]  ?

## 2021-07-10 ENCOUNTER — Other Ambulatory Visit: Payer: Self-pay | Admitting: Neurology

## 2021-07-10 MED ORDER — AMPHETAMINE-DEXTROAMPHETAMINE 30 MG PO TABS: 30.0000 mg | ORAL_TABLET | Freq: Three times a day (TID) | ORAL | 0 refills | Status: DC

## 2021-07-10 NOTE — Telephone Encounter (Signed)
Pt is needing a refill request for her amphetamine-dextroamphetamine (ADDERALL) 30 MG tablet sent to the [facility] in [place]  ?

## 2021-08-28 ENCOUNTER — Other Ambulatory Visit: Payer: Self-pay | Admitting: Neurology

## 2021-08-28 MED ORDER — AMPHETAMINE-DEXTROAMPHETAMINE 30 MG PO TABS: 30.0000 mg | ORAL_TABLET | Freq: Three times a day (TID) | ORAL | 0 refills | Status: DC

## 2021-10-02 ENCOUNTER — Other Ambulatory Visit: Payer: Self-pay | Admitting: Neurology

## 2021-10-02 MED ORDER — AMPHETAMINE-DEXTROAMPHETAMINE 30 MG PO TABS: 30.0000 mg | ORAL_TABLET | Freq: Three times a day (TID) | ORAL | 0 refills | Status: DC

## 2021-10-02 NOTE — Telephone Encounter (Signed)
Pt request refill foramphetamine-dextroamphetamine (ADDERALL) 30 MG tablet at [facility] [phone]

## 2021-10-02 NOTE — Telephone Encounter (Signed)
Pt last seen 03/29/21 and next f/u 02/07/22. Called [facility] at [phone]. Spoke w/ tech. Pt last refilled 08/30/21 #90.

## 2021-10-03 ENCOUNTER — Other Ambulatory Visit: Payer: Self-pay | Admitting: Neurology

## 2021-10-03 MED ORDER — AMPHETAMINE-DEXTROAMPHETAMINE 30 MG PO TABS: 30.0000 mg | ORAL_TABLET | Freq: Three times a day (TID) | ORAL | 0 refills | Status: DC

## 2021-10-03 NOTE — Telephone Encounter (Signed)
DUPLICATE. NO NOT FILL.

## 2021-11-21 ENCOUNTER — Other Ambulatory Visit: Payer: Self-pay | Admitting: Neurology

## 2021-11-22 ENCOUNTER — Other Ambulatory Visit: Payer: Self-pay | Admitting: Neurology

## 2021-11-22 MED ORDER — AMPHETAMINE-DEXTROAMPHETAMINE 30 MG PO TABS: 30.0000 mg | ORAL_TABLET | Freq: Three times a day (TID) | ORAL | 0 refills | Status: DC

## 2021-11-22 NOTE — Telephone Encounter (Signed)
Pt is requesting a refill for amphetamine-dextroamphetamine (ADDERALL) 30 MG tablet.  Pharmacy:  [facility] [phone]

## 2021-11-26 ENCOUNTER — Other Ambulatory Visit: Payer: Self-pay | Admitting: *Deleted

## 2021-11-26 MED ORDER — AMPHETAMINE-DEXTROAMPHETAMINE 30 MG PO TABS: 30.0000 mg | ORAL_TABLET | Freq: Three times a day (TID) | ORAL | 0 refills | Status: DC

## 2021-11-26 NOTE — Addendum Note (Signed)
Addended by: Wyvonnia Lora on: 11/26/2021 04:03 PM   Modules accepted: Orders

## 2021-11-26 NOTE — Addendum Note (Signed)
Addended by: Wyvonnia Lora on: 11/26/2021 03:26 PM   Modules accepted: Orders

## 2021-11-26 NOTE — Telephone Encounter (Signed)
Pt has called to report she was able to find her   amphetamine-dextroamphetamine (ADDERALL) 30 MG tablet. At [place], [place]

## 2022-01-09 ENCOUNTER — Other Ambulatory Visit: Payer: Self-pay | Admitting: Neurology

## 2022-01-10 MED ORDER — AMPHETAMINE-DEXTROAMPHETAMINE 30 MG PO TABS: 30.0000 mg | ORAL_TABLET | Freq: Three times a day (TID) | ORAL | 0 refills | Status: DC

## 2022-02-07 ENCOUNTER — Encounter: Payer: Self-pay | Admitting: Neurology

## 2022-02-07 ENCOUNTER — Ambulatory Visit: Payer: Self-pay | Admitting: Neurology

## 2022-02-07 VITALS — BP 95/66 | HR 84 | Ht 59.0 in | Wt 104.5 lb

## 2022-02-07 DIAGNOSIS — M5431 Sciatica, right side: Secondary | ICD-10-CM

## 2022-02-07 DIAGNOSIS — F988 Other specified behavioral and emotional disorders with onset usually occurring in childhood and adolescence: Secondary | ICD-10-CM

## 2022-02-07 DIAGNOSIS — F418 Other specified anxiety disorders: Secondary | ICD-10-CM

## 2022-02-07 DIAGNOSIS — G47411 Narcolepsy with cataplexy: Secondary | ICD-10-CM

## 2022-02-07 DIAGNOSIS — G43009 Migraine without aura, not intractable, without status migrainosus: Secondary | ICD-10-CM

## 2022-02-07 DIAGNOSIS — M5432 Sciatica, left side: Secondary | ICD-10-CM

## 2022-02-07 DIAGNOSIS — G40B09 Juvenile myoclonic epilepsy, not intractable, without status epilepticus: Secondary | ICD-10-CM

## 2022-02-07 MED ORDER — AMPHETAMINE-DEXTROAMPHETAMINE 30 MG PO TABS: 30.0000 mg | ORAL_TABLET | Freq: Three times a day (TID) | ORAL | 0 refills | Status: DC

## 2022-02-07 MED ORDER — LAMOTRIGINE 200 MG PO TABS: 200.0000 mg | ORAL_TABLET | Freq: Two times a day (BID) | ORAL | 4 refills | Status: DC

## 2022-02-07 MED ORDER — PROTRIPTYLINE HCL 10 MG PO TABS: 10.0000 mg | ORAL_TABLET | Freq: Every day | ORAL | 4 refills | Status: DC

## 2022-02-07 MED ORDER — FLUOXETINE HCL 40 MG PO CAPS: 40.0000 mg | ORAL_CAPSULE | Freq: Every day | ORAL | 4 refills | Status: DC

## 2022-02-07 NOTE — Progress Notes (Signed)
[facility]  PATIENT: Abigail Lowery DOB: 1985/09/18   _________________________________   HISTORICAL  CHIEF COMPLAINT:  Chief Complaint  Patient presents with   Follow-up    Pt in room #11 and alone. Pt here today for f/u on her juvenile myoclonic epilepsy.     HISTORY OF PRESENT ILLNESS:  Abigail Lowery is a 36 y.o. woman with juvenile myoclonic epilepsy , narcolepsy, migraine headaches and sciatica.     Update 02/07/2022 She has JME diagnosed as a teenager.  She had a seizure in July - first GTC since 2009.  She had a grand mal seizure in 2021 while sleep deprived and missed a couple doses of lamotrigine.   She also felt she was dehydrated at the time.   She has not missed any doses recently. .   She usually does not have the morning myoclonus anymore.  She feels her narcolepsy is reasonably well controlled on Adderall 30 mg po tid and protriptyline. She has shifted her nights to 2 am to 10-11 am.  She does not feel Adderall affects her sleep onset.  She is getting 7 hours a night most nights.  Sometimes difficult waking up.   She occasionally takes  a nap.   The protriptyline has greatly helped her cataplexy none in several years    Also no recent sleep paralysis spells - probably around 2020.   Dreams are vivid but only rare hypnagogic hallucinations.  Occasionally she is not sure if a dream was real.    The Adderall also helps her ADD.  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      Her migraines are now only 1/month and sumatriptan usually helps.   She usually takes sumatriptan as soon as one is starting and they do not last long.    Sciatica type pain (likely piriformis related) still occurs intermittently.  TPI's into piriformis muscles helped her sciatica ibut she has not done one recently.  History: Seizure history:    She was diagnosed with JME around age 28. 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  Similar staring spells occurred and she had an EEG with a pattern consistent with juvenile myoclonic epilepsy. In the omornings, she would often have mild clonus. She still has mild clonus at times in the morning. At age 56, she had a single convulsive seizure. Initially, she was placed on Depakote but due to some breakthrough staring spells and poor tolerability, she was switched to Lamictal.   Narcolepsy History:  She was diagnosed with narcolepsy in 2008 after presenting with severe sleepiness. She had PSG and MSLT in 2008 showing severe hypersomnia and sleep onset REM consistent with narcolepsy. She also had mild OSA on that initial sleep study.   A repeat PSG 06/28/2012 showed no significant OSA (AHI equals 0.2). She did have some snoring. She also had moderate periodic limb movements of sleep with an index of 25.3 and an arousal index of 11.9.. She was placed on stimulants and is currently on Adderall 30 mg 3 times a day. She tolerates this dose well. She feels her hypersomnia does well once she is awake.   She sets 3 alarms because she has trouble getting up many mornings. She gets very rare episodes of cataplexy.   Sleep paralysis events have been helped by protriptyline.[name] She will have hypnagogic hallucinations at times.      REVIEW OF SYSTEMS: Constitutional: No fevers, chills, sweats, or change in appetite  Eyes: No visual changes, double vision, eye pain Ear, nose and throat: No hearing loss, ear pain, nasal congestion, sore throat Cardiovascular: No chest pain, palpitations Respiratory:  No shortness of breath at rest or with exertion.   No wheezes GastrointestinaI: No nausea, vomiting, diarrhea, abdominal pain, fecal incontinence Genitourinary:  No dysuria, urinary retention or frequency.  No nocturia. Musculoskeletal:  No neck pain.   Back pain as above Integumentary: No rash, pruritus, skin lesions Neurological: as above Psychiatric: No depression at this time.  No anxiety Endocrine:  No palpitations, diaphoresis, change in appetite, change in weigh or increased thirst Hematologic/Lymphatic:  No anemia, purpura, petechiae. Allergic/Immunologic: No itchy/runny eyes, nasal congestion, recent allergic reactions, rashes  ALLERGIES: Allergies  Allergen Reactions   Dust Mite Extract Itching   Mold Extract [Trichophyton] Itching    HOME MEDICATIONS:  Current Outpatient Medications:    amphetamine-dextroamphetamine (ADDERALL) 30 MG tablet, Take 1 tablet by mouth 3 (three) times daily., Disp: 90 tablet, Rfl: 0   FLUoxetine (PROZAC) 40 MG capsule, Take 1 capsule (40 mg total) by mouth daily., Disp: 90 capsule, Rfl: 4   lamoTRIgine (LAMICTAL) 200 MG tablet, Take 1 tablet (200 mg total) by mouth 2 (two) times daily., Disp: 180 tablet, Rfl: 3   promethazine (PHENERGAN) 25 MG tablet, Take 1 tablet (25 mg total) by mouth every 6 (six) hours as needed for nausea., Disp: 30 tablet, Rfl: 1   protriptyline (VIVACTIL) 10 MG tablet, Take 1 tablet (10 mg total) by mouth at bedtime., Disp: 90 tablet, Rfl: 4   SUMAtriptan (IMITREX) 100 MG tablet, Take one po prn headache.   May repeat in 2 hours if headache persists or recurs., Disp: 30 tablet, Rfl: 3  PAST MEDICAL HISTORY: Past Medical History:  Diagnosis Date   Anxiety    Colitis 2003   Depression    Epilepsy (HCC)    Headache(784.0)    Hyperlipidemia    Migraines    Narcolepsy    Narcolepsy    Seizures (HCC)    Petit mal    PAST SURGICAL HISTORY: Past Surgical History:  Procedure Laterality Date   ADENOIDECTOMY  1998   BILATERAL HIP ARTHROSCOPY  2011, 2012   WRIST SURGERY Right 2004    FAMILY HISTORY: Family History  Problem Relation Age of Onset   Depression Mother    Anxiety disorder Mother    Depression Father    Anxiety disorder Maternal Aunt    Depression Maternal Aunt    Bipolar disorder Maternal Uncle    Schizophrenia Maternal Grandfather    Bipolar disorder Cousin     PHYSICAL EXAM   Today's Vitals    02/07/22 1307  BP: 95/66  Pulse: 84  Weight: 104 lb 8 oz (47.4 kg)  Height: 4\' 11"  (1.499 m)   Body mass index is 21.11 kg/m.    General: The patient is well-developed and well-nourished and in no acute distress   Musculoskeletal:   S there is moderate tenderness over the piriformis muscles, left greater than right.  Trochanteric bursae are nontender   Range of motion is normal in the back.  Neurologic Exam   Mental status: The patient is alert and oriented x 3 at the time of the examination. The patient has apparent normal recent and remote memory, with an apparently normal attention span and concentration ability.   Speech is normal.   Cranial nerves: Extraocular movements are full.  Facial strength and sensation is normal.  The trapezius strength is normal.  Her hearing  is symmetric.   Motor:  Muscle bulk is normal.  Muscle tone and muscle strength are normal.   Sensory:   She had intact sensation to touch in the arms and legs.   Coordination: Cerebellar testing reveals good finger-nose-finger and heel-to-shin bilaterally.   Gait and station: Station is normal.  Gait and tandem gait are normal.   Reflexes: Deep tendon reflexes are symmetric and normal.     __________________________________________________________  Nonintractable juvenile myoclonic epilepsy without status epilepticus (HCC)  Narcolepsy and cataplexy  Attention deficit disorder, unspecified hyperactivity presence  Bilateral sciatica  Migraine without aura and without status migrainosus, not intractable  Depression with anxiety   Continue lamotrigine for JME.   Continue Adderall and protriptyline Prozac for mood Sumatriptan prn migraine TPI bilateral piriformis muscles with 80 mg Depo-Medrol in [place] using sterile echnique.   She tolerated the injeciton well and pain was better afterwards. 6.   RTC 6 months - can do virtual if back in [place].  Brit Carbonell A. Epimenio Foot, MD, PhD 02/07/2022, 1:48  PM Certified in Neurology, Clinical Neurophysiology, Sleep Medicine, Pain Medicine and Neuroimaging  [facility] [address], [address] [place], [place] [address] [phone]

## 2022-04-11 ENCOUNTER — Other Ambulatory Visit: Payer: Self-pay | Admitting: Neurology

## 2022-04-15 ENCOUNTER — Other Ambulatory Visit: Payer: Self-pay | Admitting: Neurology

## 2022-04-15 MED ORDER — AMPHETAMINE-DEXTROAMPHETAMINE 30 MG PO TABS: 30.0000 mg | ORAL_TABLET | Freq: Three times a day (TID) | ORAL | 0 refills | Status: DC

## 2022-04-15 NOTE — Telephone Encounter (Signed)
Pt is requesting a refill for amphetamine-dextroamphetamine (ADDERALL) 30 MG tablet.  Pharmacy: [id number] [facility]

## 2022-04-15 NOTE — Telephone Encounter (Signed)
Pt last seen 02/07/22 and next f/u 08/15/22. Per drug registry, last refilled 02/11/22  #90.

## 2022-05-21 ENCOUNTER — Other Ambulatory Visit: Payer: Self-pay | Admitting: Neurology

## 2022-05-22 MED ORDER — AMPHETAMINE-DEXTROAMPHETAMINE 30 MG PO TABS: 30.0000 mg | ORAL_TABLET | Freq: Three times a day (TID) | ORAL | 0 refills | Status: DC

## 2022-07-04 ENCOUNTER — Other Ambulatory Visit: Payer: Self-pay | Admitting: Neurology

## 2022-07-04 MED ORDER — AMPHETAMINE-DEXTROAMPHETAMINE 30 MG PO TABS: 30.0000 mg | ORAL_TABLET | Freq: Three times a day (TID) | ORAL | 0 refills | Status: DC

## 2022-08-13 ENCOUNTER — Telehealth: Payer: Self-pay | Admitting: Neurology

## 2022-08-15 ENCOUNTER — Telehealth (INDEPENDENT_AMBULATORY_CARE_PROVIDER_SITE_OTHER): Payer: Self-pay | Admitting: Neurology

## 2022-08-15 ENCOUNTER — Encounter: Payer: Self-pay | Admitting: Neurology

## 2022-08-15 DIAGNOSIS — G43009 Migraine without aura, not intractable, without status migrainosus: Secondary | ICD-10-CM

## 2022-08-15 DIAGNOSIS — M5432 Sciatica, left side: Secondary | ICD-10-CM

## 2022-08-15 DIAGNOSIS — G47411 Narcolepsy with cataplexy: Secondary | ICD-10-CM

## 2022-08-15 DIAGNOSIS — G40B09 Juvenile myoclonic epilepsy, not intractable, without status epilepticus: Secondary | ICD-10-CM

## 2022-08-15 DIAGNOSIS — F988 Other specified behavioral and emotional disorders with onset usually occurring in childhood and adolescence: Secondary | ICD-10-CM

## 2022-08-15 DIAGNOSIS — M5431 Sciatica, right side: Secondary | ICD-10-CM

## 2022-08-15 MED ORDER — PROTRIPTYLINE HCL 10 MG PO TABS: 10.0000 mg | ORAL_TABLET | Freq: Every day | ORAL | 4 refills | Status: DC

## 2022-08-15 MED ORDER — SUMATRIPTAN SUCCINATE 100 MG PO TABS: ORAL_TABLET | ORAL | 3 refills | Status: AC

## 2022-08-15 MED ORDER — AMPHETAMINE-DEXTROAMPHETAMINE 30 MG PO TABS: 30.0000 mg | ORAL_TABLET | Freq: Three times a day (TID) | ORAL | 0 refills | Status: DC

## 2022-08-15 NOTE — Progress Notes (Signed)
[facility]  PATIENT: Abigail Lowery DOB: 11-12-1985   _________________________________   HISTORICAL  CHIEF COMPLAINT:  Chief Complaint  Patient presents with   Seizures   Other    Narcolepsy      HISTORY OF PRESENT ILLNESS:  Abigail Lowery is a 37 y.o. woman with juvenile myoclonic epilepsy , narcolepsy, migraine headaches and sciatica.     Update 12/09/2019: Virtual Visit via Video Note I connected with [name]  on 08/15/22 at  2:30 PM EDT by a video enabled telemedicine application and verified that I am speaking with the correct person.  I discussed the limitations of evaluation and management by telemedicine and the availability of in person appointments. The patient expressed understanding and agreed to proceed.     Patient was at her home.  Provider in office.    History of Present Illness: She has JME diagnosed as a teenager.  She had a seizure in July - first GTC since 2009.  She had a grand mal seizure in 2021 while sleep deprived and missed a couple doses of lamotrigine.   She also felt she was dehydrated at the time.   She has not missed any doses recently. .   She usually does not have the morning myoclonus anymore.   She feels her narcolepsy is reasonably well controlled on Adderall 30 mg po tid and protriptyline. She has shifted her nights to 2 am to 10-11 am.  She does not feel Adderall affects her sleep onset.  She is getting 7 hours a night most nights.  Sometimes difficult waking up.   She occasionally takes  a nap.   The protriptyline has greatly helped her cataplexy none in several years    Also no recent sleep paralysis spells - probably around 2020.   Dreams are vivid but only rare hypnagogic hallucinations.  Occasionally she is not sure if a dream was real.     The Adderall also helps her ADD.  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       Her migraines are now only 1/month and sumatriptan  usually helps. Last severe one was 2 months ago.    She usually takes sumatriptan as soon as one is starting and they do not last long.     Sciatica type pain (likely piriformis related) still occurs intermittently.  TPI's into piriformis muscles helped her sciatica but she has not done one recently.   History: Seizure history:    She was diagnosed with JME around age 81. 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 Similar staring spells occurred and she had an EEG with a pattern consistent with juvenile myoclonic epilepsy. In the omornings, she would often have mild clonus. She still has mild clonus at times in the morning. At age 89, she had a single convulsive seizure. Initially, she was placed on Depakote but due to some breakthrough staring spells and poor tolerability, she was switched to Lamictal.    Narcolepsy History:  She was diagnosed with narcolepsy in 2008 after presenting with severe sleepiness. She had PSG and MSLT in 2008 showing severe hypersomnia and sleep onset REM consistent with narcolepsy. She also had mild OSA on that initial sleep study.   A repeat PSG 06/28/2012 showed no significant OSA (AHI equals 0.2). She did have some snoring. She also had moderate periodic limb movements of sleep with an index of 25.3 and an arousal index of 11.9.. She was  placed on stimulants and is currently on Adderall 30 mg 3 times a day. She tolerates this dose well. She feels her hypersomnia does well once she is awake.   She sets 3 alarms because she has trouble getting up many mornings. She gets very rare episodes of cataplexy.   Sleep paralysis events have been helped by protriptyline.[name] She will have hypnagogic hallucinations at times.   Observations/Objective  She is a well-developed well-nourished woman in no acute distress.  The head is normocephalic and atraumatic.  Sclera are anicteric.  Visible skin appears normal.  The neck has a good range of motion.     She is alert and fully  oriented with fluent speech and good attention, knowledge and memory.  Extraocular muscles are intact.  Facial strength appears normal  Rapid alternating movements and finger-nose-finger are performed well.  Assessment and Plan: Nonintractable juvenile myoclonic epilepsy without status epilepticus (HCC)  Narcolepsy and cataplexy  Attention deficit disorder, unspecified hyperactivity presence  Bilateral sciatica  Migraine without aura and without status migrainosus, not intractable  1.  Continue Adderall 30 mg tid for narcolepsy related sleepiness and ADD.  Continue Protriptyline for cataplexy. 2.   Continue lamotrigine 200 mg po bid for JME.  She has been very compliant since seizure in 2021. 3.   Migraines are doing very well with the protriptyline.  Migraine occurs she will take sumatriptan. 4.   Stay active and exercise as tolerated.  5.   Return 6 months or sooner if there are new or worsening neurologic symptoms.   Follow Up Instructions: I discussed the assessment and treatment plan with the patient. The patient was provided an opportunity to ask questions and all were answered. The patient agreed with the plan and demonstrated an understanding of the instructions.    The patient was advised to call back or seek an in-person evaluation if the symptoms worsen or if the condition fails to improve as anticipated.  I provided 17 minutes of non-face-to-face time during this encounter.  REVIEW OF SYSTEMS: Constitutional: No fevers, chills, sweats, or change in appetite Eyes: No visual changes, double vision, eye pain Ear, nose and throat: No hearing loss, ear pain, nasal congestion, sore throat Cardiovascular: No chest pain, palpitations Respiratory:  No shortness of breath at rest or with exertion.   No wheezes GastrointestinaI: No nausea, vomiting, diarrhea, abdominal pain, fecal incontinence Genitourinary:  No dysuria, urinary retention or frequency.  No nocturia. Musculoskeletal:   No neck pain.   Back pain as above Integumentary: No rash, pruritus, skin lesions Neurological: as above Psychiatric: No depression at this time.  No anxiety Endocrine: No palpitations, diaphoresis, change in appetite, change in weigh or increased thirst Hematologic/Lymphatic:  No anemia, purpura, petechiae. Allergic/Immunologic: No itchy/runny eyes, nasal congestion, recent allergic reactions, rashes  ALLERGIES: Allergies  Allergen Reactions   Dust Mite Extract Itching   Mold Extract [Trichophyton] Itching    HOME MEDICATIONS:  Current Outpatient Medications:    amphetamine-dextroamphetamine (ADDERALL) 30 MG tablet, Take 1 tablet by mouth 3 (three) times daily., Disp: 90 tablet, Rfl: 0   FLUoxetine (PROZAC) 40 MG capsule, Take 1 capsule (40 mg total) by mouth daily., Disp: 90 capsule, Rfl: 4   lamoTRIgine (LAMICTAL) 200 MG tablet, Take 1 tablet (200 mg total) by mouth 2 (two) times daily., Disp: 180 tablet, Rfl: 4   promethazine (PHENERGAN) 25 MG tablet, Take 1 tablet (25 mg total) by mouth every 6 (six) hours as needed for nausea., Disp: 30 tablet, Rfl: 1  protriptyline (VIVACTIL) 10 MG tablet, Take 1 tablet (10 mg total) by mouth at bedtime., Disp: 90 tablet, Rfl: 4   SUMAtriptan (IMITREX) 100 MG tablet, Take one po prn headache.   May repeat in 2 hours if headache persists or recurs., Disp: 30 tablet, Rfl: 3  PAST MEDICAL HISTORY: Past Medical History:  Diagnosis Date   Anxiety    Colitis 2003   Depression    Epilepsy (HCC)    Headache(784.0)    Hyperlipidemia    Migraines    Narcolepsy    Narcolepsy    Seizures (HCC)    Petit mal    PAST SURGICAL HISTORY: Past Surgical History:  Procedure Laterality Date   ADENOIDECTOMY  1998   BILATERAL HIP ARTHROSCOPY  2011, 2012   WRIST SURGERY Right 2004    FAMILY HISTORY: Family History  Problem Relation Age of Onset   Depression Mother    Anxiety disorder Mother    Depression Father    Anxiety disorder Maternal Aunt     Depression Maternal Aunt    Bipolar disorder Maternal Uncle    Schizophrenia Maternal Grandfather    Bipolar disorder Cousin     Dane Kopke A. Epimenio Foot, MD, PhD 08/15/2022, 3:11 PM Certified in Neurology, Clinical Neurophysiology, Sleep Medicine, Pain Medicine and Neuroimaging  [facility] [address], [address] [place], [place] [address] [phone]

## 2022-09-27 ENCOUNTER — Other Ambulatory Visit: Payer: Self-pay | Admitting: Neurology

## 2022-09-27 NOTE — Telephone Encounter (Signed)
Pt request refill for amphetamine-dextroamphetamine (ADDERALL) 30 MG tablet sent to [facility] #[id number] [employer]

## 2022-09-30 MED ORDER — AMPHETAMINE-DEXTROAMPHETAMINE 30 MG PO TABS: 30.0000 mg | ORAL_TABLET | Freq: Three times a day (TID) | ORAL | 0 refills | Status: DC

## 2022-09-30 NOTE — Telephone Encounter (Signed)
Last seen on 08/15/22 video visit Follow up scheduled on 02/27/23 Last filled on 08/19/22 #90 tablets  Rx pending to be signed

## 2022-11-14 ENCOUNTER — Other Ambulatory Visit: Payer: Self-pay | Admitting: Neurology

## 2022-11-14 MED ORDER — AMPHETAMINE-DEXTROAMPHETAMINE 30 MG PO TABS: 30.0000 mg | ORAL_TABLET | Freq: Three times a day (TID) | ORAL | 0 refills | Status: DC

## 2022-11-14 NOTE — Addendum Note (Signed)
Addended by: Aura Camps on: 11/14/2022 03:09 PM   Modules accepted: Orders

## 2022-11-14 NOTE — Telephone Encounter (Signed)
Pt is requesting a refill for amphetamine-dextroamphetamine (ADDERALL) 30 MG tablet.  Pharmacy: [facility] [phone]

## 2022-11-14 NOTE — Telephone Encounter (Addendum)
Dr.Athar you are work in provider this pm. Dr.Sater patient last seen on 08/15/22 Follow up scheduled on 02/05/23 Last filled on 10/05/22 #90 tablets (30 day supply) Rx pending to be signed

## 2022-12-27 ENCOUNTER — Other Ambulatory Visit: Payer: Self-pay | Admitting: Neurology

## 2022-12-27 MED ORDER — AMPHETAMINE-DEXTROAMPHETAMINE 30 MG PO TABS: 30.0000 mg | ORAL_TABLET | Freq: Three times a day (TID) | ORAL | 0 refills | Status: DC

## 2022-12-27 NOTE — Telephone Encounter (Signed)
Last seen 08/15/2022 and next f/u 02/05/23. Last refilled 11/15/22 #90. Pt due for refill.

## 2022-12-27 NOTE — Telephone Encounter (Signed)
Pt request refill for amphetamine-dextroamphetamine (ADDERALL) 30 MG tablet send to [id number] [employer]

## 2023-02-05 ENCOUNTER — Telehealth (INDEPENDENT_AMBULATORY_CARE_PROVIDER_SITE_OTHER): Payer: 59 | Admitting: Neurology

## 2023-02-05 ENCOUNTER — Encounter: Payer: Self-pay | Admitting: Neurology

## 2023-02-05 DIAGNOSIS — M5432 Sciatica, left side: Secondary | ICD-10-CM

## 2023-02-05 DIAGNOSIS — M5431 Sciatica, right side: Secondary | ICD-10-CM | POA: Diagnosis not present

## 2023-02-05 DIAGNOSIS — G47411 Narcolepsy with cataplexy: Secondary | ICD-10-CM

## 2023-02-05 DIAGNOSIS — G43009 Migraine without aura, not intractable, without status migrainosus: Secondary | ICD-10-CM

## 2023-02-05 DIAGNOSIS — G40B09 Juvenile myoclonic epilepsy, not intractable, without status epilepticus: Secondary | ICD-10-CM | POA: Diagnosis not present

## 2023-02-05 DIAGNOSIS — F418 Other specified anxiety disorders: Secondary | ICD-10-CM

## 2023-02-05 MED ORDER — AMPHETAMINE-DEXTROAMPHETAMINE 30 MG PO TABS: 30.0000 mg | ORAL_TABLET | Freq: Three times a day (TID) | ORAL | 0 refills | Status: DC

## 2023-02-05 MED ORDER — LAMOTRIGINE 200 MG PO TABS: 200.0000 mg | ORAL_TABLET | Freq: Two times a day (BID) | ORAL | 4 refills | Status: DC

## 2023-02-05 NOTE — Progress Notes (Signed)
[facility]  PATIENT: Abigail Lowery DOB: [birth year]   _________________________________   HISTORICAL  CHIEF COMPLAINT:  Chief Complaint  Patient presents with   Seizures   Narcolepsy     HISTORY OF PRESENT ILLNESS:  Abigail Lowery is a 37 y.o. woman with juvenile myoclonic epilepsy , narcolepsy, migraine headaches and sciatica.     Update 02/05/2023: Virtual Visit via Video Note I connected with Abigail Lowery  on 02/05/23 at  1:30 PM EST by a video enabled telemedicine application and verified that I am speaking with the correct person.  I discussed the limitations of evaluation and management by telemedicine and the availability of in person appointments. The patient expressed understanding and agreed to proceed.     Patient was at her home.  Provider in office.    History of Present Illness: She denies any seizures since 2021.  She has JME diagnosed as a teenager.  She had a seizure in July - first GTC since 2009.  She had a grand mal seizure in 2021 while sleep deprived and missed a couple doses of lamotrigine.   She also felt she was dehydrated at the time.   She has not missed any doses recently.   She only rarely has morning myoclonus..   She has done very well on the current narcolepsy regimen.  She is on Adderall 30 mg po tid and protriptyline. She has shifted her nights to 2 am to 10-11 am.  She does not feel Adderall affects her sleep onset.  She is getting 6-7 hours a night most nights.  Sometimes difficult waking up.   She occasionally takes  a nap.   The protriptyline has greatly helped her cataplexy none in several years    Also no recent sleep paralysis spells - probably around 2020.   Dreams are vivid but only rare hypnagogic hallucinations.  Rarely she is not sure if a dream was real.     The Adderall also helps her ADD.  Her migraines are now only <1/month and sumatriptan usually helps.    She usually takes sumatriptan as soon as one is  starting and they do not last long.     Sciatica type pain (likely piriformis related) still occurs intermittently.  TPI's into piriformis muscles helped her sciatica but she has not done one recently.   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         History: Seizure history:    She was diagnosed with JME around age 44. 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 Similar staring spells occurred and she had an EEG with a pattern consistent with juvenile myoclonic epilepsy. In the omornings, she would often have mild clonus. She still has mild clonus at times in the morning. At age 45, she had a single convulsive seizure. Initially, she was placed on Depakote but due to some breakthrough staring spells and poor tolerability, she was switched to Lamictal.    Narcolepsy History:  She was diagnosed with narcolepsy in 2008 after presenting with severe sleepiness. She had PSG and MSLT in 2008 showing severe hypersomnia and sleep onset REM consistent with narcolepsy. She also had mild OSA on that initial sleep study.   A repeat PSG 06/28/2012 showed no significant OSA (AHI equals 0.2). She did have some snoring. She also had moderate periodic limb movements of sleep with an index of 25.3 and an arousal index of 11.9.. She was placed on  stimulants and is currently on Adderall 30 mg 3 times a day. She tolerates this dose well. She feels her hypersomnia does well once she is awake.   She sets 3 alarms because she has trouble getting up many mornings. She gets very rare episodes of cataplexy.   Sleep paralysis events have been helped by protriptyline.[name] She will have hypnagogic hallucinations at times.   Observations/Objective  She is a well-developed well-nourished woman in no acute distress.  The head is normocephalic and atraumatic.  Sclera are anicteric.  Visible skin appears normal.  The neck has a good range of motion.     She is alert and fully oriented with fluent  speech and good attention, knowledge and memory.  Extraocular muscles are intact.  Facial strength appears normal  Rapid alternating movements and finger-nose-finger are performed well.  Assessment and Plan: Nonintractable juvenile myoclonic epilepsy without status epilepticus (HCC)  Narcolepsy and cataplexy  Bilateral sciatica  Migraine without aura and without status migrainosus, not intractable  Depression with anxiety   1.  Continue Adderall 30 mg tid and protriptyline nightly for narcolepsy related sleepiness, cataplexy and ADD.  Continue Protriptyline for cataplexy. 2.   Continue lamotrigine 200 mg po bid for JME.  She has been very compliant since seizure in 2021. 3.   Migraines are doing very well with the protriptyline.  Migraine occurs she will take sumatriptan. 4.   Stay active and exercise as tolerated.  5.   Return 6 months or sooner if there are new or worsening neurologic symptoms.   Follow Up Instructions: I discussed the assessment and treatment plan with the patient. The patient was provided an opportunity to ask questions and all were answered. The patient agreed with the plan and demonstrated an understanding of the instructions.    The patient was advised to call back or seek an in-person evaluation if the symptoms worsen or if the condition fails to improve as anticipated.  I provided 15 minutes of non-face-to-face time during this encounter.  REVIEW OF SYSTEMS: Constitutional: No fevers, chills, sweats, or change in appetite Eyes: No visual changes, double vision, eye pain Ear, nose and throat: No hearing loss, ear pain, nasal congestion, sore throat Cardiovascular: No chest pain, palpitations Respiratory:  No shortness of breath at rest or with exertion.   No wheezes GastrointestinaI: No nausea, vomiting, diarrhea, abdominal pain, fecal incontinence Genitourinary:  No dysuria, urinary retention or frequency.  No nocturia. Musculoskeletal:  No neck pain.    Back pain as above Integumentary: No rash, pruritus, skin lesions Neurological: as above Psychiatric: No depression at this time.  No anxiety Endocrine: No palpitations, diaphoresis, change in appetite, change in weigh or increased thirst Hematologic/Lymphatic:  No anemia, purpura, petechiae. Allergic/Immunologic: No itchy/runny eyes, nasal congestion, recent allergic reactions, rashes  ALLERGIES: Allergies  Allergen Reactions   Dust Mite Extract Itching   Mold Extract [Trichophyton] Itching    HOME MEDICATIONS:  Current Outpatient Medications:    amphetamine-dextroamphetamine (ADDERALL) 30 MG tablet, Take 1 tablet by mouth 3 (three) times daily., Disp: 90 tablet, Rfl: 0   FLUoxetine (PROZAC) 40 MG capsule, Take 1 capsule (40 mg total) by mouth daily., Disp: 90 capsule, Rfl: 4   lamoTRIgine (LAMICTAL) 200 MG tablet, Take 1 tablet (200 mg total) by mouth 2 (two) times daily., Disp: 180 tablet, Rfl: 4   promethazine (PHENERGAN) 25 MG tablet, Take 1 tablet (25 mg total) by mouth every 6 (six) hours as needed for nausea., Disp: 30 tablet, Rfl: 1  protriptyline (VIVACTIL) 10 MG tablet, Take 1 tablet (10 mg total) by mouth at bedtime., Disp: 90 tablet, Rfl: 4   SUMAtriptan (IMITREX) 100 MG tablet, Take one po prn headache.   May repeat in 2 hours if headache persists or recurs., Disp: 30 tablet, Rfl: 3  PAST MEDICAL HISTORY: Past Medical History:  Diagnosis Date   Anxiety    Colitis 2003   Depression    Epilepsy (HCC)    Headache(784.0)    Hyperlipidemia    Migraines    Narcolepsy    Narcolepsy    Seizures (HCC)    Petit mal    PAST SURGICAL HISTORY: Past Surgical History:  Procedure Laterality Date   ADENOIDECTOMY  1998   BILATERAL HIP ARTHROSCOPY  2011, 2012   WRIST SURGERY Right 2004    FAMILY HISTORY: Family History  Problem Relation Age of Onset   Depression Mother    Anxiety disorder Mother    Depression Father    Anxiety disorder Maternal Aunt    Depression  Maternal Aunt    Bipolar disorder Maternal Uncle    Schizophrenia Maternal Grandfather    Bipolar disorder Cousin     Selma Mink A. Epimenio Foot, MD, PhD 02/05/2023, 1:46 PM Certified in Neurology, Clinical Neurophysiology, Sleep Medicine, Pain Medicine and Neuroimaging  [facility] [address] [place], [place] [address] [phone]

## 2023-02-27 ENCOUNTER — Telehealth: Payer: Self-pay | Admitting: Neurology

## 2023-04-01 ENCOUNTER — Other Ambulatory Visit: Payer: Self-pay | Admitting: Neurology

## 2023-04-01 MED ORDER — AMPHETAMINE-DEXTROAMPHETAMINE 30 MG PO TABS: 30.0000 mg | ORAL_TABLET | Freq: Three times a day (TID) | ORAL | 0 refills | Status: DC

## 2023-04-01 NOTE — Addendum Note (Signed)
Addended by: Judi Cong on: 04/01/2023 05:07 PM   Modules accepted: Orders

## 2023-04-01 NOTE — Telephone Encounter (Signed)
Pt is requesting a refill for amphetamine-dextroamphetamine (ADDERALL) 30 MG tablet.  Pharmacy: [facility] [phone]

## 2023-04-02 MED ORDER — AMPHETAMINE-DEXTROAMPHETAMINE 30 MG PO TABS: 30.0000 mg | ORAL_TABLET | Freq: Three times a day (TID) | ORAL | 0 refills | Status: DC

## 2023-04-02 NOTE — Telephone Encounter (Signed)
Pt calling refill was sent to the wrong pharmacy, Please send amphetamine-dextroamphetamine (ADDERALL) 30 MG tablet. to correct pharmacy, [facility] [phone]

## 2023-04-02 NOTE — Addendum Note (Signed)
Addended by: Arther Abbott on: 04/02/2023 04:05 PM   Modules accepted: Orders

## 2023-04-02 NOTE — Telephone Encounter (Signed)
Called [facility] at [phone]. Cx rx e-scribed there on 04/01/23.   Sent to MD to e-scribe to [facility] instead for her.

## 2023-05-02 ENCOUNTER — Other Ambulatory Visit: Payer: Self-pay | Admitting: Neurology

## 2023-05-13 ENCOUNTER — Other Ambulatory Visit: Payer: Self-pay | Admitting: Neurology

## 2023-05-13 MED ORDER — AMPHETAMINE-DEXTROAMPHETAMINE 30 MG PO TABS
30.0000 mg | ORAL_TABLET | Freq: Three times a day (TID) | ORAL | 0 refills | Status: DC
Start: 2023-05-13 — End: 2023-07-01

## 2023-05-13 NOTE — Telephone Encounter (Signed)
 LAST SEEN 02/05/23, next appt 08/20/23  Dispenses   Dispensed Days Supply Quantity Provider Pharmacy  AMPHETAMINE SALTS 30MG  TAB(GEN ADDERALL) 04/05/2023  90 each  Unspecified  AMPHETAMINE SALTS 30MG  TAB(GEN ADDERALL) 02/10/2023  90 each  Unspecified  AMPHETAMINE SALTS 30MG  TAB(GEN ADDERALL) 01/01/2023  90 each  Unspecified  AMPHETAMINE SALTS 30MG  TAB(GEN ADDERALL) 11/15/2022  90 each  Unspecified  AMPHETAMINE SALTS 30MG  TAB(GEN ADDERALL) 10/05/2022  90 each  Unspecified  AMPHETAMINE SALTS 30MG  TAB(GEN ADDERALL) 08/19/2022  90 each  Unspecified  AMPHETAMINE SALTS 30MG  TAB(GEN ADDERALL) 07/09/2022  90 each  Unspecified  AMPHETAMINE SALTS 30MG  TAB(GEN ADDERALL) 05/26/2022  90 each  Unspecified

## 2023-07-01 ENCOUNTER — Other Ambulatory Visit: Payer: Self-pay | Admitting: Neurology

## 2023-07-01 MED ORDER — AMPHETAMINE-DEXTROAMPHETAMINE 30 MG PO TABS
30.0000 mg | ORAL_TABLET | Freq: Three times a day (TID) | ORAL | 0 refills | Status: AC
Start: 2023-07-01 — End: ?

## 2023-07-01 NOTE — Telephone Encounter (Signed)
 Last seen 02/05/23 and next f/u 08/20/23. Last refilled 05/18/23 #90

## 2023-08-20 ENCOUNTER — Telehealth: Payer: 59 | Admitting: Neurology

## 2023-08-20 ENCOUNTER — Encounter: Payer: Self-pay | Admitting: Neurology

## 2023-08-20 ENCOUNTER — Telehealth: Payer: Self-pay | Admitting: *Deleted

## 2023-08-20 DIAGNOSIS — G47411 Narcolepsy with cataplexy: Secondary | ICD-10-CM

## 2023-08-20 DIAGNOSIS — G40B09 Juvenile myoclonic epilepsy, not intractable, without status epilepticus: Secondary | ICD-10-CM

## 2023-08-20 DIAGNOSIS — M5431 Sciatica, right side: Secondary | ICD-10-CM

## 2023-08-20 DIAGNOSIS — G43009 Migraine without aura, not intractable, without status migrainosus: Secondary | ICD-10-CM | POA: Diagnosis not present

## 2023-08-20 DIAGNOSIS — M5432 Sciatica, left side: Secondary | ICD-10-CM

## 2023-08-20 MED ORDER — AMPHETAMINE-DEXTROAMPHETAMINE 30 MG PO TABS: 30.0000 mg | ORAL_TABLET | Freq: Three times a day (TID) | ORAL | 0 refills | Status: DC

## 2023-08-20 MED ORDER — PROTRIPTYLINE HCL 10 MG PO TABS: 10.0000 mg | ORAL_TABLET | Freq: Every day | ORAL | 4 refills | Status: AC

## 2023-08-20 NOTE — Telephone Encounter (Signed)
 [facility] pharmacy called stating a drug interaction between Adderall 30 mg tablet and Protriptyline  10 mg how would he like to proceed.  I checked with Dr.Sater and he told me okay to fill medication as the Protriptyline  is a low dose. I relayed this information to pharmacist.

## 2023-08-20 NOTE — Progress Notes (Signed)
 [facility]  PATIENT: Abigail Lowery DOB: 1986/01/27   _________________________________   HISTORICAL  CHIEF COMPLAINT:  Chief Complaint  Patient presents with   Other    Narcolepsy   Seizures     HISTORY OF PRESENT ILLNESS:  Abigail Lowery is a 38 y.o. woman with juvenile myoclonic epilepsy , narcolepsy, migraine headaches and sciatica.     Update 02/05/2023: Virtual Visit via Video Note I connected with Abigail Lowery  on 08/20/23 at  2:30 PM EDT by a video enabled telemedicine application and verified that I am speaking with the correct person.  I discussed the limitations of evaluation and management by telemedicine and the availability of in person appointments. The patient expressed understanding and agreed to proceed.     Patient was at her home.  Provider in office.    History of Present Illness: She has JME diagnosed as a teenager.  She had  her last seizure in July 2021 - first GTC since 2009.  She had a grand mal seizure in 2021 while sleep deprived and missed a couple doses of lamotrigine .   She also felt she was dehydrated at the time.   She has not missed any doses recently.   She only rarely has morning myoclonus..   She has done very well on the current narcolepsy regimen.  She is on Adderall 30 mg po tid and protriptyline . She has shifted her nights to 2 am to 10-11 am.  She does not feel Adderall affects her sleep onset.  She is getting 6-8 hours a night most nights.  Sometimes difficult waking up.   She takes a nap 2-3 times a week and often feels better afterwards.   The protriptyline  has greatly helped her cataplexy none in several years    Also no recent sleep paralysis spells - probably around 2020.   Dreams are vivid but only rare hypnagogic hallucinations.  Protriptyline  greatly helps the dreams.     Adderall helps her ADD, as well.  Since he protriptyline  was started, migraine also improved.  Her migraines are now only <1/month and  sumatriptan  usually helps.    She usually takes sumatriptan  as soon as one is starting and they do not last long.     Sciatica type pain (likely piriformis related) bothers her off/on still occurs intermittently.  TPI's into piriformis muscles helped her sciatica but she has not done one recently.     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         History: Seizure history:    She was diagnosed with JME around age 4. 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 Similar staring spells occurred and she had an EEG with a pattern consistent with juvenile myoclonic epilepsy. In the omornings, she would often have mild clonus. She still has mild clonus at times in the morning. At age 43, she had a single convulsive seizure. Initially, she was placed on Depakote but due to some breakthrough staring spells and poor tolerability, she was switched to Lamictal .    Narcolepsy History:  She was diagnosed with narcolepsy in 2008 after presenting with severe sleepiness. She had PSG and MSLT in 2008 showing severe hypersomnia and sleep onset REM consistent with narcolepsy. She also had mild OSA on that initial sleep study.   A repeat PSG 06/28/2012 showed no significant OSA (AHI equals 0.2). She did have some snoring. She also had moderate periodic limb movements of  sleep with an index of 25.3 and an arousal index of 11.9.. She was placed on stimulants and is currently on Adderall 30 mg 3 times a day. She tolerates this dose well. She feels her hypersomnia does well once she is awake.   She sets 3 alarms because she has trouble getting up many mornings. She gets very rare episodes of cataplexy.   Sleep paralysis events have been helped by protriptyline .. She will have hypnagogic hallucinations at times.   Observations/Objective  She is a well-developed well-nourished woman in no acute distress.  The head is normocephalic and atraumatic.  Sclera are anicteric.  Visible skin  appears normal.  The neck has a good range of motion.     She is alert and fully oriented with fluent speech and good attention, knowledge and memory.  Extraocular muscles are intact.  Facial strength appears normal  Rapid alternating movements and finger-nose-finger are performed well.  Assessment and Plan: Nonintractable juvenile myoclonic epilepsy without status epilepticus (HCC)  Narcolepsy and cataplexy  Bilateral sciatica  Migraine without aura and without status migrainosus, not intractable   1.  Adderall 30 mg tid nightly for narcolepsy related sleepiness and for ADD.  Continue Protriptyline  for cataplexy. 2.   Continue lamotrigine  200 mg po bid for JME.  She has been very compliant since seizure in 2021. 3.   Migraines are doing very well with the protriptyline  - just 1-2 a month now.  If a migraine occurs she will take sumatriptan . 4.   Stay active and exercise as tolerated.  5.   Return 6 months or sooner if there are new or worsening neurologic symptoms.   Follow Up Instructions: I discussed the assessment and treatment plan with the patient. The patient was provided an opportunity to ask questions and all were answered. The patient agreed with the plan and demonstrated an understanding of the instructions.    The patient was advised to call back or seek an in-person evaluation if the symptoms worsen or if the condition fails to improve as anticipated.  I provided 17 minutes of non-face-to-face time during this encounter.  REVIEW OF SYSTEMS: Constitutional: No fevers, chills, sweats, or change in appetite Eyes: No visual changes, double vision, eye pain Ear, nose and throat: No hearing loss, ear pain, nasal congestion, sore throat Cardiovascular: No chest pain, palpitations Respiratory:  No shortness of breath at rest or with exertion.   No wheezes GastrointestinaI: No nausea, vomiting, diarrhea, abdominal pain, fecal incontinence Genitourinary:  No dysuria, urinary  retention or frequency.  No nocturia. Musculoskeletal:  No neck pain.   Back pain as above Integumentary: No rash, pruritus, skin lesions Neurological: as above Psychiatric: No depression at this time.  No anxiety Endocrine: No palpitations, diaphoresis, change in appetite, change in weigh or increased thirst Hematologic/Lymphatic:  No anemia, purpura, petechiae. Allergic/Immunologic: No itchy/runny eyes, nasal congestion, recent allergic reactions, rashes  ALLERGIES: Allergies  Allergen Reactions   Dust Mite Extract Itching   Mold Extract [Trichophyton] Itching    HOME MEDICATIONS:  Current Outpatient Medications:    amphetamine -dextroamphetamine  (ADDERALL) 30 MG tablet, Take 1 tablet by mouth 3 (three) times daily., Disp: 90 tablet, Rfl: 0   FLUoxetine  (PROZAC ) 40 MG capsule, TAKE 1 CAPSULE(40 MG) BY MOUTH DAILY, Disp: 90 capsule, Rfl: 4   lamoTRIgine  (LAMICTAL ) 200 MG tablet, TAKE 1 TABLET(200 MG) BY MOUTH TWICE DAILY, Disp: 180 tablet, Rfl: 4   promethazine  (PHENERGAN ) 25 MG tablet, Take 1 tablet (25 mg total) by mouth every 6 (  six) hours as needed for nausea., Disp: 30 tablet, Rfl: 1   protriptyline  (VIVACTIL ) 10 MG tablet, Take 1 tablet (10 mg total) by mouth at bedtime., Disp: 90 tablet, Rfl: 4   SUMAtriptan  (IMITREX ) 100 MG tablet, Take one po prn headache.   May repeat in 2 hours if headache persists or recurs., Disp: 30 tablet, Rfl: 3  PAST MEDICAL HISTORY: Past Medical History:  Diagnosis Date   Anxiety    Colitis 2003   Depression    Epilepsy (HCC)    Headache(784.0)    Hyperlipidemia    Migraines    Narcolepsy    Narcolepsy    Seizures (HCC)    Petit mal    PAST SURGICAL HISTORY: Past Surgical History:  Procedure Laterality Date   ADENOIDECTOMY  1998   BILATERAL HIP ARTHROSCOPY  2011, 2012   WRIST SURGERY Right 2004    FAMILY HISTORY: Family History  Problem Relation Age of Onset   Depression Mother    Anxiety disorder Mother    Depression Father     Anxiety disorder Maternal Aunt    Depression Maternal Aunt    Bipolar disorder Maternal Uncle    Schizophrenia Maternal Grandfather    Bipolar disorder Cousin     Lititia Sen A. Godwin Lat, MD, PhD 08/20/2023, 7:28 PM Certified in Neurology, Clinical Neurophysiology, Sleep Medicine, Pain Medicine and Neuroimaging  [facility] [address] [place], [place] [address] [phone]

## 2023-08-21 ENCOUNTER — Telehealth: Payer: Self-pay

## 2023-08-21 NOTE — Telephone Encounter (Signed)
 LVM for pt to call back so that we can schedule her for her 6 month F/U.

## 2023-08-21 NOTE — Progress Notes (Signed)
 Called pt back and got her scheduled for her 6 month F/U 02/16/24 @2 :30pm

## 2023-08-21 NOTE — Telephone Encounter (Signed)
 Called pt and got her scheduled for her 6 month F/U VV with Dr. Godwin Lat for 02/16/2024 @ 2:30pm.

## 2023-08-21 NOTE — Telephone Encounter (Addendum)
 LVM for pt to call back so that we can schedule her for her 6 month F/U.   ----- Message from Jorie Newness sent at 08/20/2023  7:29 PM EDT ----- F/u in 6 months

## 2023-08-21 NOTE — Progress Notes (Signed)
 LVM for pt to call back to get scheduled for her 6 month F/U

## 2023-10-07 ENCOUNTER — Telehealth: Payer: Self-pay | Admitting: Neurology

## 2023-10-07 MED ORDER — AMPHETAMINE-DEXTROAMPHETAMINE 30 MG PO TABS: 30.0000 mg | ORAL_TABLET | Freq: Three times a day (TID) | ORAL | 0 refills | Status: DC

## 2023-10-07 NOTE — Telephone Encounter (Signed)
 Pt is requesting a refill for amphetamine-dextroamphetamine (ADDERALL) 30 MG tablet.  Pharmacy: [facility] [phone]

## 2023-10-07 NOTE — Telephone Encounter (Signed)
 Pt Last Seen 08/20/2023 Upcoming Appointment 02/16/2024  Adderall Last filled 08/24/2023

## 2023-10-17 ENCOUNTER — Telehealth: Payer: Self-pay | Admitting: Neurology

## 2023-10-17 NOTE — Telephone Encounter (Signed)
 Late entry: Patient called the after-hours call service on 10/15/2023.  I was able to connect to the patient, she reported that she accidentally took lamotrigine twice and felt that she had overdosed.  She felt lightheaded, dizzy, nauseated and had an increased heart rate.  I was able to talk to the patient, she was able to speak in full sentences, she did not sound in distress, she did not sound sluggish.  She reported that she took a dose of lamotrigine 200 mg strength around noon, she woke up late, and took another dose around 4 PM.  She did not take the 2 pills together but took the evening dose too early and was not even sure if her symptoms were related to taking it too close together.  She was reassured.  Rapid heart rate and nausea were not common side effects with lamotrigine and in fact her dose was not very high to begin with.  She was advised to go to the emergency room if she felt that her symptoms were worsening or if she had any new symptoms that were alarming but she was encouraged to skip the nighttime dose as she had already taken 2 doses for the day and monitor her symptoms.  She demonstrated understanding and agreement with the plan.

## 2023-11-21 ENCOUNTER — Other Ambulatory Visit: Payer: Self-pay | Admitting: Neurology

## 2023-11-21 MED ORDER — AMPHETAMINE-DEXTROAMPHETAMINE 30 MG PO TABS: 30.0000 mg | ORAL_TABLET | Freq: Three times a day (TID) | ORAL | 0 refills | Status: DC

## 2023-11-21 NOTE — Telephone Encounter (Signed)
 Meds ordered this encounter  Medications   amphetamine -dextroamphetamine  (ADDERALL) 30 MG tablet    Sig: Take 1 tablet by mouth 3 (three) times daily.    Dispense:  90 tablet    Refill:  0

## 2023-11-21 NOTE — Telephone Encounter (Signed)
 Pt called to request meddication refill  amphetamine -dextroamphetamine  (ADDERALL) 30 MG tablet   Pt states medication is to be sent to   [id number], [place] - [address] (Ph: [phone])

## 2024-01-07 ENCOUNTER — Other Ambulatory Visit: Payer: Self-pay | Admitting: Neurology

## 2024-01-07 NOTE — Telephone Encounter (Signed)
 Pt is requesting a refill for amphetamine-dextroamphetamine (ADDERALL) 30 MG tablet.  Pharmacy: [facility] [phone]

## 2024-01-08 MED ORDER — AMPHETAMINE-DEXTROAMPHETAMINE 30 MG PO TABS: 30.0000 mg | ORAL_TABLET | Freq: Three times a day (TID) | ORAL | 0 refills | Status: DC

## 2024-01-08 NOTE — Telephone Encounter (Signed)
  Dispensed Days Supply Quantity Provider Pharmacy   AMPHETAMINE  SALTS 30MG  TAB(GEN ADDERALL) 11/24/2023  90 each  Unspecified  AMPHETAMINE  SALTS 30MG  TAB(GEN ADDERALL) 10/13/2023  90 each  Unspecified  AMPHETAMINE  SALTS 30MG  TAB(GEN ADDERALL) 08/24/2023  90 each  Unspecified  AMPHETAMINE  SALTS 30MG  TAB(GEN ADDERALL) 07/02/2023  90 each  Unspecified  AMPHETAMINE  SALTS 30MG  TAB(GEN ADDERALL) 05/18/2023  90 each  Unspecified  AMPHETAMINE  SALTS 30MG  TAB(GEN ADDERALL) 04/05/2023  90 each  Unspecified  AMPHETAMINE  SALTS 30MG  TAB(GEN ADDERALL) 02/10/2023  90 each  Unspecified  Last visit 08/20/23 Next visit 02/16/24

## 2024-02-16 ENCOUNTER — Telehealth: Payer: Self-pay | Admitting: Neurology

## 2024-02-16 ENCOUNTER — Telehealth: Admitting: Neurology

## 2024-02-16 NOTE — Telephone Encounter (Signed)
 Pt is requesting a refill for (ADDERALL) 30 MG tablet.  Pharmacy: [id number]

## 2024-02-17 MED ORDER — AMPHETAMINE-DEXTROAMPHETAMINE 30 MG PO TABS: 30.0000 mg | ORAL_TABLET | Freq: Three times a day (TID) | ORAL | 0 refills | Status: DC

## 2024-02-17 NOTE — Telephone Encounter (Signed)
 Requested Prescriptions   Pending Prescriptions Disp Refills   amphetamine -dextroamphetamine  (ADDERALL) 30 MG tablet 90 tablet 0    Sig: Take 1 tablet by mouth 3 (three) times daily.   LAST seen 08/20/23 Next appt 08/24/24  Dispenses   Dispensed Days Supply Quantity Provider Pharmacy  AMPHETAMINE  SALTS 30MG  TAB(GEN ADDERALL) 01/12/2024  90 each  Unspecified  AMPHETAMINE  SALTS 30MG  TAB(GEN ADDERALL) 11/24/2023  90 each  Unspecified  AMPHETAMINE  SALTS 30MG  TAB(GEN ADDERALL) 10/13/2023  90 each  Unspecified  AMPHETAMINE  SALTS 30MG  TAB(GEN ADDERALL) 08/24/2023  90 each  Unspecified  AMPHETAMINE  SALTS 30MG  TAB(GEN ADDERALL) 07/02/2023  90 each  Unspecified  AMPHETAMINE  SALTS 30MG  TAB(GEN ADDERALL) 05/18/2023  90 each  Unspecified  AMPHETAMINE  SALTS 30MG  TAB(GEN ADDERALL) 04/05/2023  90 each  Unspecified

## 2024-04-09 ENCOUNTER — Other Ambulatory Visit: Payer: Self-pay | Admitting: Neurology

## 2024-04-09 MED ORDER — AMPHETAMINE-DEXTROAMPHETAMINE 30 MG PO TABS: 30.0000 mg | ORAL_TABLET | Freq: Three times a day (TID) | ORAL | 0 refills | Status: AC

## 2024-04-09 NOTE — Telephone Encounter (Signed)
 Last seen 08/20/23, next appt 08/24/24  Dispenses   Dispensed Days Supply Quantity Provider Pharmacy  AMPHETAMINE  SALTS 30MG  TAB(GEN ADDERALL) 02/27/2024  90 each  Unspecified  AMPHETAMINE  SALTS 30MG  TAB(GEN ADDERALL) 01/12/2024  90 each  Unspecified  AMPHETAMINE  SALTS 30MG  TAB(GEN ADDERALL) 11/24/2023  90 each  Unspecified  AMPHETAMINE  SALTS 30MG  TAB(GEN ADDERALL) 10/13/2023  90 each  Unspecified  AMPHETAMINE  SALTS 30MG  TAB(GEN ADDERALL) 08/24/2023  90 each  Unspecified  AMPHETAMINE  SALTS 30MG  TAB(GEN ADDERALL) 07/02/2023  90 each  Unspecified  AMPHETAMINE  SALTS 30MG  TAB(GEN ADDERALL) 05/18/2023  90 each  Unspecified

## 2024-04-09 NOTE — Telephone Encounter (Signed)
Pt is requesting a refill for amphetamine-dextroamphetamine (ADDERALL) 30 MG tablet.  Pharmacy: [id number] [facility]

## 2024-08-24 ENCOUNTER — Ambulatory Visit: Admitting: Neurology
